# Patient Record
Sex: Female | Born: 1995 | Race: Black or African American | Hispanic: No | Smoking: Never smoker
Health system: Southern US, Community
[De-identification: ages and names within clinical notes are randomized; demographics above are authoritative.]

## PROBLEM LIST (undated history)

## (undated) DIAGNOSIS — Z789 Other specified health status: Secondary | ICD-10-CM

## (undated) HISTORY — PX: WISDOM TOOTH EXTRACTION: SHX21

## (undated) HISTORY — PX: TONSILLECTOMY: SUR1361

---

## 2013-01-05 ENCOUNTER — Emergency Department: Payer: Self-pay | Admitting: Emergency Medicine

## 2013-01-05 LAB — RAPID INFLUENZA A&B ANTIGENS

## 2020-09-04 DIAGNOSIS — Z03818 Encounter for observation for suspected exposure to other biological agents ruled out: Secondary | ICD-10-CM | POA: Diagnosis not present

## 2020-09-04 DIAGNOSIS — Z20822 Contact with and (suspected) exposure to covid-19: Secondary | ICD-10-CM | POA: Diagnosis not present

## 2020-09-10 ENCOUNTER — Other Ambulatory Visit: Payer: Self-pay

## 2020-09-10 ENCOUNTER — Ambulatory Visit (INDEPENDENT_AMBULATORY_CARE_PROVIDER_SITE_OTHER): Payer: Self-pay | Admitting: Obstetrics and Gynecology

## 2020-09-10 ENCOUNTER — Encounter: Payer: Self-pay | Admitting: Obstetrics and Gynecology

## 2020-09-10 VITALS — BP 125/85 | HR 87 | Ht 62.0 in | Wt 174.9 lb

## 2020-09-10 DIAGNOSIS — N912 Amenorrhea, unspecified: Secondary | ICD-10-CM | POA: Diagnosis not present

## 2020-09-10 LAB — POCT URINE PREGNANCY: Preg Test, Ur: POSITIVE — AB

## 2020-09-10 MED ORDER — VITAFOL GUMMIES 3.33-0.333-34.8 MG PO CHEW: CHEWABLE_TABLET | ORAL | 11 refills | Status: DC

## 2020-09-10 NOTE — Progress Notes (Signed)
HPI:      [name] is a 24 y.o. No obstetric history on file. who LMP was No LMP recorded.  Subjective:   She presents today for pregnancy confirmation.  By last menstrual period she is approximately 8 weeks estimated gestational age.  She is not having any nausea or vomiting.  She has not yet started prenatal vitamins. (Father the baby is a Health and safety inspector) Patient is interested in genetic testing and AFP testing for spina bifida.    Hx: The following portions of the patient's history were reviewed and updated as appropriate:             She  has no past medical history on file. She does not have a problem list on file. She  has a past surgical history that includes Tonsillectomy and Wisdom tooth extraction. Her family history includes Breast cancer in her mother; Hypertension in her mother. She  reports that she has never smoked. She has never used smokeless tobacco. She reports current alcohol use. She reports current drug use. Drug: Marijuana. She has a current medication list which includes the following prescription(s): vitafol gummies. She is allergic to red dye.       Review of Systems:  Review of Systems  Constitutional: Denied constitutional symptoms, night sweats, recent illness, fatigue, fever, insomnia and weight loss.  Eyes: Denied eye symptoms, eye pain, photophobia, vision change and visual disturbance.  Ears/Nose/Throat/Neck: Denied ear, nose, throat or neck symptoms, hearing loss, nasal discharge, sinus congestion and sore throat.  Cardiovascular: Denied cardiovascular symptoms, arrhythmia, chest pain/pressure, edema, exercise intolerance, orthopnea and palpitations.  Respiratory: Denied pulmonary symptoms, asthma, pleuritic pain, productive sputum, cough, dyspnea and wheezing.  Gastrointestinal: Denied, gastro-esophageal reflux, melena, nausea and vomiting.  Genitourinary: Denied genitourinary symptoms including symptomatic vaginal discharge, pelvic relaxation  issues, and urinary complaints.  Musculoskeletal: Denied musculoskeletal symptoms, stiffness, swelling, muscle weakness and myalgia.  Dermatologic: Denied dermatology symptoms, rash and scar.  Neurologic: Denied neurology symptoms, dizziness, headache, neck pain and syncope.  Psychiatric: Denied psychiatric symptoms, anxiety and depression.  Endocrine: Denied endocrine symptoms including hot flashes and night sweats.   Meds:   No current outpatient medications on file prior to visit.   No current facility-administered medications on file prior to visit.          Objective:     Vitals:   09/10/20 1010  BP: 125/85  Pulse: 87   Filed Weights   09/10/20 1010  Weight: 174 lb 14.4 oz (79.3 kg)              Urinary pregnancy test positive  Assessment:    No obstetric history on file. There are no problems to display for this patient.    1. Amenorrhea     Approximately [redacted] weeks gestational age based on last menstrual period  Not having any problems at this time.   Plan:            1.  Prenatal Plan 1.  The patient was given prenatal literature. 2.  She was continued on prenatal vitamins. 3.  A prenatal lab panel was ordered or drawn. 4.  An ultrasound was ordered to better determine an EDC. 5.  A nurse visit was scheduled. 6.  Genetic testing and testing for other inheritable conditions discussed in detail. She will decide in the future whether to have these labs performed. 7.  A general overview of pregnancy testing, visit schedule, ultrasound schedule, and prenatal care was discussed. 8.  COVID and its  risks associated with pregnancy, prevention by limiting exposure and use of masks, as well as the risks and benefits of vaccination during pregnancy were discussed in detail.  [facility] policy regarding office and hospital visitation and testing was explained. 9.  Benefits of breast-feeding discussed in detail including both maternal and infant benefits. Ready [employer] Baby  website discussed.   Orders Orders Placed This Encounter  Procedures  . US OB Comp Less 14 Wks  . US OB Transvaginal  . POCT urine pregnancy     Meds ordered this encounter  Medications  . Prenatal Vit-Fe Phos-FA-Omega (VITAFOL GUMMIES) 3.33-0.333-34.8 MG CHEW    Sig: Take three gummies in the AM    Dispense:  90 tablet    Refill:  11      F/U  Return in about 4 weeks (around 10/08/2020). I spent 32 minutes involved in the care of this patient preparing to see the patient by obtaining and reviewing her medical history (including labs, imaging tests and prior procedures), documenting clinical information in the electronic health record (EHR), counseling and coordinating care plans, writing and sending prescriptions, ordering tests or procedures and directly communicating with the patient by discussing pertinent items from her history and physical exam as well as detailing my assessment and plan as noted above so that she has an informed understanding.  All of her questions were answered.  Elonda Husky, M.D. 09/10/2020 10:39 AM

## 2020-09-15 ENCOUNTER — Ambulatory Visit (INDEPENDENT_AMBULATORY_CARE_PROVIDER_SITE_OTHER): Payer: Self-pay

## 2020-09-15 ENCOUNTER — Other Ambulatory Visit: Payer: Self-pay

## 2020-09-15 DIAGNOSIS — N912 Amenorrhea, unspecified: Secondary | ICD-10-CM

## 2020-09-26 ENCOUNTER — Telehealth: Payer: Self-pay

## 2020-09-26 NOTE — Telephone Encounter (Signed)
Spoke to pt concerning her call to the office. Pt stated that she was unable to hold anything down. Pt was advised to try small meal, like rice, potatoes, popiciles and sent safe medication list to pt along with information concerning morning sickness. Pt was advised that if those medication otc did not work call the office by Monday to be prescribed morning sickness medication.

## 2020-09-26 NOTE — Telephone Encounter (Signed)
Pt called in and stated that she isn't able to keep anything down, water or Crackers. The pt is requesting something that will help. The pt uses [facility] in [place]. Please advise

## 2020-09-29 ENCOUNTER — Other Ambulatory Visit: Payer: Self-pay

## 2020-09-29 ENCOUNTER — Ambulatory Visit
Admission: EM | Admit: 2020-09-29 | Discharge: 2020-09-29 | Disposition: A | Discharge status: Home / Self Care | Payer: Medicaid Other | Attending: Emergency Medicine | Admitting: Emergency Medicine

## 2020-09-29 DIAGNOSIS — L02419 Cutaneous abscess of limb, unspecified: Secondary | ICD-10-CM | POA: Diagnosis not present

## 2020-09-29 DIAGNOSIS — L03119 Cellulitis of unspecified part of limb: Secondary | ICD-10-CM | POA: Diagnosis not present

## 2020-09-29 MED ORDER — AMOXICILLIN-POT CLAVULANATE 875-125 MG PO TABS: 1.0000 | ORAL_TABLET | Freq: Two times a day (BID) | ORAL | 0 refills | Status: AC

## 2020-09-29 NOTE — Progress Notes (Deleted)
Pt presents for NOB nurse interview visit. Pregnancy confirmation done 09/10/20 [facility] Dr. Logan Bores. G1 . P0   . Pregnancy education material explained and given. ___ cats in the home. NOB labs ordered. (TSH/HbgA1c due to increased BMI), (sickle cell). HIV labs and Drug screen were explained optional and she did not decline. Drug screen ordered. PNV encouraged. Genetic screening options discussed . Genetic testing: Ordered/Declined/Unsure. Pt may discuss with provider. Pt. To follow up with provider in 2 weeks for NOB physical. Financial policy reviewed. FMLA paperwork policy reviewed and signed. All questions answered.

## 2020-09-29 NOTE — Discharge Instructions (Addendum)
Leave the packing in place for the next 2 days and then remove.  Take the Augmentin twice daily for 10 days.  Use over-the-counter Tylenol as needed for pain.  If you have any increased redness, drainage, pain, or swelling return or follow-up with your primary care doctor.

## 2020-09-29 NOTE — ED Provider Notes (Signed)
[facility]    CSN: [id number] Arrival date & time: 09/29/20  1251      History   Chief Complaint Chief Complaint  Patient presents with   Recurrent Skin Infections    HPI Emily Bullock is a 24 y.o. female.   24 year old female here for evaluation of swollen, tender area on her right buttock.  Patient reports that she has had this off and on since she was a teenager.  Most recently it started to increase in size and become tender 1 week ago.  She has been treating it at home with heat and fat back and it has not drained.  Patient is also [redacted] weeks pregnant.     History reviewed. No pertinent past medical history.  There are no problems to display for this patient.   Past Surgical History:  Procedure Laterality Date   TONSILLECTOMY     WISDOM TOOTH EXTRACTION      OB History    Gravida  1   Para      Term      Preterm      AB      Living        SAB      TAB      Ectopic      Multiple      Live Births               Home Medications    Prior to Admission medications   Medication Sig Start Date End Date Taking? Authorizing Provider  Prenatal Vit-Fe Phos-FA-Omega (VITAFOL GUMMIES) 3.33-0.333-34.8 MG CHEW Take three gummies in the AM 09/10/20  Yes Linzie Collin, MD  amoxicillin-clavulanate (AUGMENTIN) 875-125 MG tablet Take 1 tablet by mouth every 12 (twelve) hours for 10 days. 09/29/20 10/09/20  Becky Augusta, NP    Family History Family History  Problem Relation Age of Onset   Hypertension Mother    Breast cancer Mother     Social History Social History   Tobacco Use   Smoking status: Never Smoker   Smokeless tobacco: Never Used  Building services engineer Use: Former  Substance Use Topics   Alcohol use: Yes   Drug use: Yes    Types: Marijuana     Allergies   Red dye   Review of Systems Review of Systems  Constitutional: Negative for activity change, appetite change and fever.  Respiratory: Negative  for cough.   Skin: Positive for color change.       There is a red swollen area on the right buttock.  Hematological: Negative.   Psychiatric/Behavioral: Negative.      Physical Exam Triage Vital Signs ED Triage Vitals  Enc Vitals Group     BP 09/29/20 1338 133/89     Pulse Rate 09/29/20 1338 94     Resp 09/29/20 1338 18     Temp 09/29/20 1338 98 F (36.7 C)     Temp Source 09/29/20 1338 Oral     SpO2 09/29/20 1338 100 %     Weight 09/29/20 1336 171 lb (77.6 kg)     Height 09/29/20 1336 5\' 2"  (1.575 m)     Head Circumference --      Peak Flow --      Pain Score 09/29/20 1335 8     Pain Loc --      Pain Edu? --      Excl. in GC? --    No data found.  Updated Vital Signs  BP 133/89 (BP Location: Right Arm)    Pulse 94    Temp 98 F (36.7 C) (Oral)    Resp 18    Ht 5\' 2"  (1.575 m)    Wt 171 lb (77.6 kg)    LMP 07/16/2020    SpO2 100%    BMI 31.28 kg/m   Visual Acuity Right Eye Distance:   Left Eye Distance:   Bilateral Distance:    Right Eye Near:   Left Eye Near:    Bilateral Near:     Physical Exam Vitals and nursing note reviewed.  Constitutional:      General: She is not in acute distress.    Appearance: Normal appearance.  HENT:     Head: Normocephalic and atraumatic.  Eyes:     General: No scleral icterus.    Extraocular Movements: Extraocular movements intact.     Conjunctiva/sclera: Conjunctivae normal.     Pupils: Pupils are equal, round, and reactive to light.  Cardiovascular:     Rate and Rhythm: Normal rate and regular rhythm.     Pulses: Normal pulses.     Heart sounds: Normal heart sounds. No murmur heard.  No gallop.   Pulmonary:     Effort: Pulmonary effort is normal.     Breath sounds: Normal breath sounds. No wheezing, rhonchi or rales.  Musculoskeletal:        General: No swelling or tenderness. Normal range of motion.     Cervical back: Normal range of motion and neck supple.  Skin:    General: Skin is warm and dry.     Capillary  Refill: Capillary refill takes less than 2 seconds.     Findings: Lesion present.     Comments: There is a ping-pong ball size red, fluctuant area on the right buttock.  The surrounding tissue is indurated and erythematous as well.  Neurological:     General: No focal deficit present.     Mental Status: She is alert and oriented to person, place, and time.  Psychiatric:        Mood and Affect: Mood normal.        Behavior: Behavior normal.        Thought Content: Thought content normal.        Judgment: Judgment normal.      UC Treatments / Results  Labs (all labs ordered are listed, but only abnormal results are displayed) Labs Reviewed  AEROBIC CULTURE (SUPERFICIAL SPECIMEN)    EKG   Radiology No results found.  Procedures Incision and Drainage  Date/Time: 09/29/2020 2:24 PM Performed by: 13/11/2019, NP Authorized by: Becky Augusta, NP   Consent:    Consent obtained:  Verbal   Consent given by:  Patient   Risks discussed:  Bleeding, incomplete drainage, infection and pain   Alternatives discussed:  Alternative treatment Location:    Type:  Abscess   Location:  Lower extremity   Lower extremity location:  Buttock   Buttock location:  R buttock Pre-procedure details:    Skin preparation:  Chloraprep Anesthesia (see MAR for exact dosages):    Anesthesia method:  Local infiltration   Local anesthetic:  Lidocaine 1% WITH epi (4 mL) Procedure type:    Complexity:  Simple Procedure details:    Incision types:  Stab incision   Incision depth:  Subcutaneous   Scalpel blade:  11   Wound management:  Probed and deloculated   Drainage:  Bloody and purulent   Drainage amount:  Moderate  Wound treatment:  Wound left open   Packing materials:  1/2 in gauze   Amount 1/2":  6 inches Post-procedure details:    Patient tolerance of procedure:  Tolerated well, no immediate complications   (including critical care time)  Medications Ordered in UC Medications - No  data to display  Initial Impression / Assessment and Plan / UC Course  I have reviewed the triage vital signs and the nursing notes.  Pertinent labs & imaging results that were available during my care of the patient were reviewed by me and considered in my medical decision making (see chart for details).   Patient is here for evaluation of an abscess on her right buttock.  The abscess has been getting worse over the last week and despite home remedies has not drained.  Patient is [redacted] weeks pregnant and she is here for help with pain relief.  Patient has been trying to use heat to get it to drain and has been unsuccessful at home.  We discussed continuing to apply warm compresses or lance the area and she opted for the latter.  Culture of drainage collected and sent to lab for analysis.  Will DC home on Augmentin twice daily x10 days. Final Clinical Impressions(s) / UC Diagnoses   Final diagnoses:  Cellulitis and abscess of leg     Discharge Instructions     Leave the packing in place for the next 2 days and then remove.  Take the Augmentin twice daily for 10 days.  Use over-the-counter Tylenol as needed for pain.  If you have any increased redness, drainage, pain, or swelling return or follow-up with your primary care doctor.    ED Prescriptions    Medication Sig Dispense Auth. Provider   amoxicillin-clavulanate (AUGMENTIN) 875-125 MG tablet Take 1 tablet by mouth every 12 (twelve) hours for 10 days. 20 tablet Becky Augusta, NP     PDMP not reviewed this encounter.   Becky Augusta, NP 09/29/20 1435

## 2020-09-29 NOTE — ED Triage Notes (Addendum)
Patient complains of abscess on right buttock that has been ongoing for a while. States that it worsened over the last week, hasn't drained yet. Patient is currently [redacted] weeks pregnant. Patient states that she does have a piece of fat back still on the area.

## 2020-09-30 ENCOUNTER — Telehealth: Payer: Self-pay

## 2020-09-30 LAB — AEROBIC CULTURE  (SUPERFICIAL SPECIMEN)

## 2020-09-30 NOTE — Telephone Encounter (Signed)
Rescheduled patient

## 2020-09-30 NOTE — Telephone Encounter (Signed)
Called pt- no answer °mychart message sent °

## 2020-09-30 NOTE — Telephone Encounter (Signed)
Patient called in requesting nausea medication called in for her. Could you please advise?

## 2020-10-01 DIAGNOSIS — Z130 Encounter for screening for diseases of the blood and blood-forming organs and certain disorders involving the immune mechanism: Secondary | ICD-10-CM

## 2020-10-01 DIAGNOSIS — Z131 Encounter for screening for diabetes mellitus: Secondary | ICD-10-CM

## 2020-10-01 DIAGNOSIS — Z3A09 9 weeks gestation of pregnancy: Secondary | ICD-10-CM

## 2020-10-01 LAB — AEROBIC CULTURE  (SUPERFICIAL SPECIMEN)

## 2020-10-01 MED ORDER — DOXYLAMINE-PYRIDOXINE 10-10 MG PO TBEC
2.0000 | DELAYED_RELEASE_TABLET | Freq: Every day | ORAL | 5 refills | Status: DC
Start: 2020-10-01 — End: 2021-05-03

## 2020-10-01 NOTE — Telephone Encounter (Signed)
I have sent in Diclegis to the pharmacy. LM for patient that medication has been sent to the pharmacy.

## 2020-10-02 ENCOUNTER — Other Ambulatory Visit: Payer: Self-pay

## 2020-10-02 ENCOUNTER — Ambulatory Visit: Payer: Medicaid Other

## 2020-10-02 VITALS — BP 117/80 | HR 93 | Wt 172.1 lb

## 2020-10-02 DIAGNOSIS — Z3A09 9 weeks gestation of pregnancy: Secondary | ICD-10-CM

## 2020-10-02 LAB — AEROBIC CULTURE W GRAM STAIN (SUPERFICIAL SPECIMEN)

## 2020-10-02 NOTE — Progress Notes (Signed)
Truitt Merle presents for NOB nurse interview visit. Unsure LMP. Pregnancy confirmation done 09/10/20 Dr Logan Bores. Dating and viability ultrasound done 09/15/20 [redacted]w[redacted]d- EDD 05/05/21.  G-1 P-0. Pregnancy education material explained and given. 0  cats in the home. NOB labs ordered. TSH/HbgA1c due to Increased BMI, sickle cell. HIV labs and Drug screen were explained optional and she did not decline. Drug screen ordered. PNV encouraged. Genetic screening options discussed. Genetic testing: Ordered.  Pt may discuss with provider. Pt. To follow up with provider in _2_ weeks for NOB physical.  All questions answered. FMLA form signed, Financial policy reviewed.

## 2020-10-03 LAB — HEMOGLOBIN A1C
Est. average glucose Bld gHb Est-mCnc: 108 mg/dL
Hgb A1c MFr Bld: 5.4 % (ref 4.8–5.6)

## 2020-10-03 LAB — CBC WITH DIFFERENTIAL
Basophils Absolute: 0 10*3/uL (ref 0.0–0.2)
Basos: 1 %
EOS (ABSOLUTE): 0.1 10*3/uL (ref 0.0–0.4)
Eos: 1 %
Hematocrit: 40.8 % (ref 34.0–46.6)
Hemoglobin: 13.8 g/dL (ref 11.1–15.9)
Immature Grans (Abs): 0 10*3/uL (ref 0.0–0.1)
Immature Granulocytes: 1 %
Lymphocytes Absolute: 1.7 10*3/uL (ref 0.7–3.1)
Lymphs: 24 %
MCH: 32 pg (ref 26.6–33.0)
MCHC: 33.8 g/dL (ref 31.5–35.7)
MCV: 95 fL (ref 79–97)
Monocytes Absolute: 0.5 10*3/uL (ref 0.1–0.9)
Monocytes: 7 %
Neutrophils Absolute: 4.7 10*3/uL (ref 1.4–7.0)
Neutrophils: 66 %
RBC: 4.31 x10E6/uL (ref 3.77–5.28)
RDW: 11.1 % — ABNORMAL LOW (ref 11.7–15.4)
WBC: 7 10*3/uL (ref 3.4–10.8)

## 2020-10-03 LAB — HIV ANTIBODY (ROUTINE TESTING W REFLEX): HIV Screen 4th Generation wRfx: NONREACTIVE

## 2020-10-03 LAB — ABO AND RH: Rh Factor: POSITIVE

## 2020-10-03 LAB — URINALYSIS, ROUTINE W REFLEX MICROSCOPIC

## 2020-10-03 LAB — TSH: TSH: 2.92 u[IU]/mL (ref 0.450–4.500)

## 2020-10-03 LAB — RPR: RPR Ser Ql: NONREACTIVE

## 2020-10-03 LAB — RUBELLA SCREEN: Rubella Antibodies, IGG: 4.74 index (ref >0.99)

## 2020-10-03 LAB — VARICELLA ZOSTER ANTIBODY, IGG: Varicella zoster IgG: 1999 index (ref >165)

## 2020-10-03 LAB — HEPATITIS B SURFACE ANTIGEN: Hepatitis B Surface Ag: NEGATIVE

## 2020-10-03 LAB — HGB SOLU + RFLX FRAC: Sickle Solubility Test - HGBRFX: NEGATIVE

## 2020-10-03 LAB — ANTIBODY SCREEN: Antibody Screen: NEGATIVE

## 2020-10-04 LAB — GC/CHLAMYDIA PROBE AMP
Chlamydia trachomatis, NAA: NEGATIVE
Neisseria Gonorrhoeae by PCR: NEGATIVE

## 2020-10-04 LAB — URINE CULTURE

## 2020-10-07 LAB — MATERNIT 21 PLUS CORE, BLOOD
Fetal Fraction: 6
Result (T21): NEGATIVE
Trisomy 13 (Patau syndrome): NEGATIVE
Trisomy 18 (Edwards syndrome): NEGATIVE
Trisomy 21 (Down syndrome): NEGATIVE

## 2020-10-09 ENCOUNTER — Encounter: Payer: Self-pay | Admitting: Obstetrics and Gynecology

## 2020-10-16 ENCOUNTER — Other Ambulatory Visit (HOSPITAL_COMMUNITY)
Admission: RE | Admit: 2020-10-16 | Discharge: 2020-10-16 | Disposition: A | Discharge status: Home / Self Care | Payer: Medicaid Other | Source: Ambulatory Visit | Attending: Obstetrics and Gynecology | Admitting: Obstetrics and Gynecology

## 2020-10-16 ENCOUNTER — Other Ambulatory Visit: Payer: Self-pay

## 2020-10-16 ENCOUNTER — Ambulatory Visit (INDEPENDENT_AMBULATORY_CARE_PROVIDER_SITE_OTHER): Payer: Medicaid Other | Admitting: Obstetrics and Gynecology

## 2020-10-16 ENCOUNTER — Encounter: Payer: Self-pay | Admitting: Obstetrics and Gynecology

## 2020-10-16 VITALS — BP 132/81 | HR 92 | Wt 179.6 lb

## 2020-10-16 DIAGNOSIS — Z124 Encounter for screening for malignant neoplasm of cervix: Secondary | ICD-10-CM

## 2020-10-16 DIAGNOSIS — Z3491 Encounter for supervision of normal pregnancy, unspecified, first trimester: Secondary | ICD-10-CM

## 2020-10-16 DIAGNOSIS — Z3A11 11 weeks gestation of pregnancy: Secondary | ICD-10-CM

## 2020-10-16 LAB — POCT URINALYSIS DIPSTICK OB
Bilirubin, UA: NEGATIVE
Blood, UA: NEGATIVE
Glucose, UA: NEGATIVE
Ketones, UA: NEGATIVE
Leukocytes, UA: NEGATIVE
Nitrite, UA: NEGATIVE
POC,PROTEIN,UA: NEGATIVE
Spec Grav, UA: 1.01 (ref 1.010–1.025)
Urobilinogen, UA: 0.2 E.U./dL
pH, UA: 6.5 (ref 5.0–8.0)

## 2020-10-16 NOTE — Addendum Note (Signed)
Addended by: Dorian Pod on: 10/16/2020 11:16 AM   Modules accepted: Orders

## 2020-10-16 NOTE — Progress Notes (Addendum)
NOB: Patient doing well. Has occasional headaches. Hydration discussed use of Tylenol discussed. She is otherwise well. Advised to begin prenatal vitamins as she did not pick up her prescription yet. Discussed genetic testing-normal. Patient will consider AFP at next visit. Pap performed today.  Physical examination General NAD, Conversant  HEENT Atraumatic; Op clear with mmm.  Normo-cephalic. Pupils reactive. Anicteric sclerae  Thyroid/Neck Smooth without nodularity or enlargement. Normal ROM.  Neck Supple.  Skin No rashes, lesions or ulceration. Normal palpated skin turgor. No nodularity.  Breasts: No masses or discharge.  Symmetric.  No axillary adenopathy.  Lungs: Clear to auscultation.No rales or wheezes. Normal Respiratory effort, no retractions.  Heart: NSR.  No murmurs or rubs appreciated. No periferal edema  Abdomen: Soft.  Non-tender.  No masses.  No HSM. No hernia  Extremities: Moves all appropriately.  Normal ROM for age. No lymphadenopathy.  Neuro: Oriented to PPT.  Normal mood. Normal affect.     Pelvic:   Vulva: Normal appearance.  No lesions.  Vagina: No lesions or abnormalities noted.  Support: Normal pelvic support.  Urethra No masses tenderness or scarring.  Meatus Normal size without lesions or prolapse.  Cervix: Normal appearance.  No lesions.  Anus: Normal exam.  No lesions.  Perineum: Normal exam.  No lesions.        Bimanual   Adnexae: No masses.  Non-tender to palpation.  Uterus: Enlarged. 12wks Pos FHTs  Non-tender.  Mobile.  AV.  Adnexae: No masses.  Non-tender to palpation.  Cul-de-sac: Negative for abnormality.  Adnexae: No masses.  Non-tender to palpation.         Pelvimetry   Diagonal: Reached.  Spines: Average.  Sacrum: Concave.  Pubic Arch: Normal.   The following were addressed during this visit:  Breastfeeding Education - Early initiation of breastfeeding    Comments: Keeps milk supply adequate, helps contract uterus and slow bleeding, and  early milk is the perfect first food and is easy to digest.   - The importance of exclusive breastfeeding    Comments: Provides antibodies, Lower risk of breast and ovarian cancers, and type-2 diabetes,Helps your body recover, Reduced chance of SIDS.   - The importance of early skin-to-skin contact    Comments: Keeps baby warm and secure, helps keep baby's blood sugar up and breathing steady, easier to bond and breastfeed, and helps calm baby.  - Rooming-in on a 24-hour basis    Comments: Easier to learn baby's feeding cues, easier to bond and get to know each other, and encourages milk production.  I spent 23 minutes involved in the care of this patient preparing to see the patient by obtaining and reviewing her medical history (including labs, imaging tests and prior procedures), documenting clinical information in the electronic health record (EHR), counseling and coordinating care plans, writing and sending prescriptions, ordering tests or procedures and directly communicating with the patient by discussing pertinent items from her history and physical exam as well as detailing my assessment and plan as noted above so that she has an informed understanding.  All of her questions were answered.

## 2020-10-16 NOTE — Addendum Note (Signed)
Addended by: Dorian Pod on: 10/16/2020 01:24 PM   Modules accepted: Orders

## 2020-10-17 LAB — CYTOLOGY - PAP: Diagnosis: NEGATIVE

## 2020-11-19 ENCOUNTER — Other Ambulatory Visit: Payer: Self-pay

## 2020-11-19 ENCOUNTER — Encounter: Payer: Self-pay | Admitting: Obstetrics and Gynecology

## 2020-11-19 ENCOUNTER — Ambulatory Visit (INDEPENDENT_AMBULATORY_CARE_PROVIDER_SITE_OTHER): Payer: Medicaid Other | Admitting: Obstetrics and Gynecology

## 2020-11-19 VITALS — BP 116/77 | HR 91 | Wt 186.7 lb

## 2020-11-19 DIAGNOSIS — Z3A16 16 weeks gestation of pregnancy: Secondary | ICD-10-CM

## 2020-11-19 DIAGNOSIS — Z3402 Encounter for supervision of normal first pregnancy, second trimester: Secondary | ICD-10-CM

## 2020-11-19 LAB — POCT URINALYSIS DIPSTICK OB
Bilirubin, UA: NEGATIVE
Blood, UA: NEGATIVE
Glucose, UA: NEGATIVE
Ketones, UA: NEGATIVE
Nitrite, UA: NEGATIVE
POC,PROTEIN,UA: NEGATIVE
Spec Grav, UA: 1.01 (ref 1.010–1.025)
Urobilinogen, UA: 0.2 E.U./dL
pH, UA: 7 (ref 5.0–8.0)

## 2020-11-19 NOTE — Patient Instructions (Addendum)
WHAT OB PATIENTS CAN EXPECT   Confirmation of pregnancy and ultrasound ordered if medically indicated-[redacted] weeks gestation  New OB (NOB) intake with nurse and New OB (NOB) labs- [redacted] weeks gestation  New OB (NOB) physical examination with provider- 11/[redacted] weeks gestation  Flu vaccine-[redacted] weeks gestation  Anatomy scan-[redacted] weeks gestation  Glucose tolerance test, blood work to test for anemia, T-dap vaccine-[redacted] weeks gestation  Vaginal swabs/cultures-STD/Group B strep-[redacted] weeks gestation  Appointments every 4 weeks until 28 weeks  Every 2 weeks from 28 weeks until 36 weeks  Weekly visits from 36 weeks until delivery   Second Trimester of Pregnancy  The second trimester is from week 14 through week 27 (month 4 through 6). This is often the time in pregnancy that you feel your best. Often times, morning sickness has lessened or quit. You may have more energy, and you may get hungry more often. Your unborn baby is growing rapidly. At the end of the sixth month, he or she is about 9 inches long and weighs about 1 pounds. You will likely feel the baby move between 18 and 20 weeks of pregnancy. Follow these instructions at home: Medicines  Take over-the-counter and prescription medicines only as told by your doctor. Some medicines are safe and some medicines are not safe during pregnancy.  Take a prenatal vitamin that contains at least 600 micrograms (mcg) of folic acid.  If you have trouble pooping (constipation), take medicine that will make your stool soft (stool softener) if your doctor approves. Eating and drinking   Eat regular, healthy meals.  Avoid raw meat and uncooked cheese.  If you get low calcium from the food you eat, talk to your doctor about taking a daily calcium supplement.  Avoid foods that are high in fat and sugars, such as fried and sweet foods.  If you feel sick to your stomach (nauseous) or throw up (vomit): ? Eat 4 or 5 small meals a day instead of 3 large  meals. ? Try eating a few soda crackers. ? Drink liquids between meals instead of during meals.  To prevent constipation: ? Eat foods that are high in fiber, like fresh fruits and vegetables, whole grains, and beans. ? Drink enough fluids to keep your pee (urine) clear or pale yellow. Activity  Exercise only as told by your doctor. Stop exercising if you start to have cramps.  Do not exercise if it is too hot, too humid, or if you are in a place of great height (high altitude).  Avoid heavy lifting.  Wear low-heeled shoes. Sit and stand up straight.  You can continue to have sex unless your doctor tells you not to. Relieving pain and discomfort  Wear a good support bra if your breasts are tender.  Take warm water baths (sitz baths) to soothe pain or discomfort caused by hemorrhoids. Use hemorrhoid cream if your doctor approves.  Rest with your legs raised if you have leg cramps or low back pain.  If you develop puffy, bulging veins (varicose veins) in your legs: ? Wear support hose or compression stockings as told by your doctor. ? Raise (elevate) your feet for 15 minutes, 3-4 times a day. ? Limit salt in your food. Prenatal care  Write down your questions. Take them to your prenatal visits.  Keep all your prenatal visits as told by your doctor. This is important. Safety  Wear your seat belt when driving.  Make a list of emergency phone numbers, including numbers for family, friends,  the hospital, and police and fire departments. General instructions  Ask your doctor about the right foods to eat or for help finding a counselor, if you need these services.  Ask your doctor about local prenatal classes. Begin classes before month 6 of your pregnancy.  Do not use hot tubs, steam rooms, or saunas.  Do not douche or use tampons or scented sanitary pads.  Do not cross your legs for long periods of time.  Visit your dentist if you have not done so. Use a soft toothbrush  to brush your teeth. Floss gently.  Avoid all smoking, herbs, and alcohol. Avoid drugs that are not approved by your doctor.  Do not use any products that contain nicotine or tobacco, such as cigarettes and e-cigarettes. If you need help quitting, ask your doctor.  Avoid cat litter boxes and soil used by cats. These carry germs that can cause birth defects in the baby and can cause a loss of your baby (miscarriage) or stillbirth. Contact a doctor if:  You have mild cramps or pressure in your lower belly.  You have pain when you pee (urinate).  You have bad smelling fluid coming from your vagina.  You continue to feel sick to your stomach (nauseous), throw up (vomit), or have watery poop (diarrhea).  You have a nagging pain in your belly area.  You feel dizzy. Get help right away if:  You have a fever.  You are leaking fluid from your vagina.  You have spotting or bleeding from your vagina.  You have severe belly cramping or pain.  You lose or gain weight rapidly.  You have trouble catching your breath and have chest pain.  You notice sudden or extreme puffiness (swelling) of your face, hands, ankles, feet, or legs.  You have not felt the baby move in over an hour.  You have severe headaches that do not go away when you take medicine.  You have trouble seeing. Summary  The second trimester is from week 14 through week 27 (months 4 through 6). This is often the time in pregnancy that you feel your best.  To take care of yourself and your unborn baby, you will need to eat healthy meals, take medicines only if your doctor tells you to do so, and do activities that are safe for you and your baby.  Call your doctor if you get sick or if you notice anything unusual about your pregnancy. Also, call your doctor if you need help with the right food to eat, or if you want to know what activities are safe for you. This information is not intended to replace advice given to you by  your health care provider. Make sure you discuss any questions you have with your health care provider. Document Revised: 03/09/2019 Document Reviewed: 12/21/2016 Elsevier Patient Education  2020 [employer]. Commonly Asked Questions During Pregnancy  Cats: A parasite can be excreted in cat feces.  To avoid exposure you need to have another person empty the little box.  If you must empty the litter box you will need to wear gloves.  Wash your hands after handling your cat.  This parasite can also be found in raw or undercooked meat so this should also be avoided.  Colds, Sore Throats, Flu: Please check your medication sheet to see what you can take for symptoms.  If your symptoms are unrelieved by these medications please call the office.  Dental Work: Most any dental work Investment banker, corporate recommends is  permitted.  X-rays should only be taken during the first trimester if absolutely necessary.  Your abdomen should be shielded with a lead apron during all x-rays.  Please notify your provider prior to receiving any x-rays.  Novocaine is fine; gas is not recommended.  If your dentist requires a note from [place] prior to dental work please call the office and we will provide one for you.  Exercise: Exercise is an important part of staying healthy during your pregnancy.  You may continue most exercises you were accustomed to prior to pregnancy.  Later in your pregnancy you will most likely notice you have difficulty with activities requiring balance like riding a bicycle.  It is important that you listen to your body and avoid activities that put you at a higher risk of falling.  Adequate rest and staying well hydrated are a must!  If you have questions about the safety of specific activities ask your provider.    Exposure to Children with illness: Try to avoid obvious exposure; report any symptoms to [place] when noted,  If you have chicken pos, red measles or mumps, you should be immune to these diseases.   Please do  not take any vaccines while pregnant unless you have checked with your OB provider.  Fetal Movement: After 28 weeks we recommend you do "kick counts" twice daily.  Lie or sit down in a calm quiet environment and count your baby movements "kicks".  You should feel your baby at least 10 times per hour.  If you have not felt 10 kicks within the first hour get up, walk around and have something sweet to eat or drink then repeat for an additional hour.  If count remains less than 10 per hour notify your provider.  Fumigating: Follow your pest control agent's advice as to how long to stay out of your home.  Ventilate the area well before re-entering.  Hemorrhoids:   Most over-the-counter preparations can be used during pregnancy.  Check your medication to see what is safe to use.  It is important to use a stool softener or fiber in your diet and to drink lots of liquids.  If hemorrhoids seem to be getting worse please call the office.   Hot Tubs:  Hot tubs Jacuzzis and saunas are not recommended while pregnant.  These increase your internal body temperature and should be avoided.  Intercourse:  Sexual intercourse is safe during pregnancy as long as you are comfortable, unless otherwise advised by your provider.  Spotting may occur after intercourse; report any bright red bleeding that is heavier than spotting.  Labor:  If you know that you are in labor, please go to the hospital.  If you are unsure, please call the office and let us help you decide what to do.  Lifting, straining, etc:  If your job requires heavy lifting or straining please check with your provider for any limitations.  Generally, you should not lift items heavier than that you can lift simply with your hands and arms (no back muscles)  Painting:  Paint fumes do not harm your pregnancy, but may make you ill and should be avoided if possible.  Latex or water based paints have less odor than oils.  Use adequate ventilation while  painting.  Permanents & Hair Color:  Chemicals in hair dyes are not recommended as they cause increase hair dryness which can increase hair loss during pregnancy.  " Highlighting" and permanents are allowed.  Dye may be absorbed differently  and permanents may not hold as well during pregnancy.  Sunbathing:  Use a sunscreen, as skin burns easily during pregnancy.  Drink plenty of fluids; avoid over heating.  Tanning Beds:  Because their possible side effects are still unknown, tanning beds are not recommended.  Ultrasound Scans:  Routine ultrasounds are performed at approximately 20 weeks.  You will be able to see your baby's general anatomy an if you would like to know the gender this can usually be determined as well.  If it is questionable when you conceived you may also receive an ultrasound early in your pregnancy for dating purposes.  Otherwise ultrasound exams are not routinely performed unless there is a medical necessity.  Although you can request a scan we ask that you pay for it when conducted because insurance does not cover " patient request" scans.  Work: If your pregnancy proceeds without complications you may work until your due date, unless your physician or employer advises otherwise.  Round Ligament Pain/Pelvic Discomfort:  Sharp, shooting pains not associated with bleeding are fairly common, usually occurring in the second trimester of pregnancy.  They tend to be worse when standing up or when you remain standing for long periods of time.  These are the result of pressure of certain pelvic ligaments called "round ligaments".  Rest, Tylenol and heat seem to be the most effective relief.  As the womb and fetus grow, they rise out of the pelvis and the discomfort improves.  Please notify the office if your pain seems different than that described.  It may represent a more serious condition.  Common Medications Safe in Pregnancy  Acne:      Constipation:  Benzoyl  Peroxide     Colace  Clindamycin      Dulcolax Suppository  Topica Erythromycin     Fibercon  Salicylic Acid      Metamucil         Miralax AVOID:        Senakot   Accutane    Cough:  Retin-A       Cough Drops  Tetracycline      Phenergan w/ Codeine if Rx  Minocycline      Robitussin (Plain & DM)  Antibiotics:     Crabs/Lice:  Ceclor       RID  Cephalosporins    AVOID:  E-Mycins      Kwell  Keflex  Macrobid/Macrodantin   Diarrhea:  Penicillin      Kao-Pectate  Zithromax      Imodium AD         PUSH FLUIDS AVOID:       Cipro     Fever:  Tetracycline      Tylenol (Regular or Extra  Minocycline       Strength)  Levaquin      Extra Strength-Do not          Exceed 8 tabs/24 hrs Caffeine:        200mg /day (equiv. To 1 cup of coffee or  approx. 3 12 oz sodas)         Gas: Cold/Hayfever:       Gas-X  Benadryl      Mylicon  Claritin       Phazyme  **Claritin-D        Chlor-Trimeton    Headaches:  Dimetapp      ASA-Free Excedrin  Drixoral-Non-Drowsy     Cold Compress  Mucinex (Guaifenasin)     Tylenol (Regular or Extra  Sudafed/Sudafed-12 Hour     Strength)  **Sudafed PE Pseudoephedrine   Tylenol Cold & Sinus     Vicks Vapor Rub  Zyrtec  **AVOID if Problems With Blood Pressure         Heartburn: Avoid lying down for at least 1 hour after meals  Aciphex      Maalox     Rash:  Milk of Magnesia     Benadryl    Mylanta       1% Hydrocortisone Cream  Pepcid  Pepcid Complete   Sleep Aids:  Prevacid      Ambien   Prilosec       Benadryl  Rolaids       Chamomile Tea  Tums (Limit 4/day)     Unisom         Tylenol PM         Warm milk-add vanilla or  Hemorrhoids:       Sugar for taste  Anusol/Anusol H.C.  (RX: Analapram 2.5%)  Sugar Substitutes:  Hydrocortisone OTC     Ok in moderation  Preparation H      Tucks        Vaseline lotion applied to tissue with wiping    Herpes:     Throat:  Acyclovir      Oragel  Famvir  Valtrex     Vaccines:         Flu  Shot Leg Cramps:       *Gardasil  Benadryl      Hepatitis A         Hepatitis B Nasal Spray:       Pneumovax  Saline Nasal Spray     Polio Booster         Tetanus Nausea:       Tuberculosis test or PPD  Vitamin B6 25 mg TID   AVOID:    Dramamine      *Gardasil  Emetrol       Live Poliovirus  Ginger Root 250 mg QID    MMR (measles, mumps &  High Complex Carbs @ Bedtime    rebella)  Sea Bands-Accupressure    Varicella (Chickenpox)  Unisom 1/2 tab TID     *No known complications           If received before Pain:         Known pregnancy;   Darvocet       Resume series after  Lortab        Delivery  Percocet    Yeast:   Tramadol      Femstat  Tylenol 3      Gyne-lotrimin  Ultram       Monistat  Vicodin           MISC:         All Sunscreens           Hair Coloring/highlights          Insect Repellant's          (Including DEET)         Mystic Tans

## 2020-11-19 NOTE — Progress Notes (Signed)
ROB: Patient reports concerns about when her next ultrasound is. Has been very nervous since she has only felt her baby once. Discussed prenatal care and what to expect.  She also notes breast tenderness. Discussed relief measures. Normal MaterniT21. RTC in 4 weeks, for anatomy scan and AFP at that time (lab closed prior to completion of today's visit). Declines flu vaccine.

## 2020-11-19 NOTE — Progress Notes (Signed)
ROB-Pt present for routine prenatal care.  Pt stated that she was doing well. Pt did have some issues at work concerning needing work limitations.

## 2020-11-29 NOTE — L&D Delivery Note (Signed)
       Delivery Note   Emily Bullock is a 25 y.o. G1P1001 at [redacted]w[redacted]d Estimated Date of Delivery: 05/05/21  PRE-OPERATIVE DIAGNOSIS:  1) [redacted]w[redacted]d pregnancy.    POST-OPERATIVE DIAGNOSIS:  1) [redacted]w[redacted]d pregnancy s/p Vaginal, Spontaneous    Delivery Type: Vaginal, Spontaneous    Delivery Anesthesia: Epidural   Labor Complications:      ESTIMATED BLOOD LOSS: 275  ml    FINDINGS:   1) female infant, Apgar scores of    at 1 minute and    at 5 minutes and a birthweight of 105.82  ounces.    2) Nuchal cord:   SPECIMENS:   PLACENTA:   Appearance: Intact    Removal: Spontaneous      Disposition:    DISPOSITION:  Infant to transported to Neonatal ICU  NARRATIVE SUMMARY: Labor course:  [name] is a G1P1001 at [redacted]w[redacted]d who presented for labor management.  She progressed slowly in labor.  Had a cat 2 tracing throughout labor with mode mec noted at [facility].  She received pitocin argumentation of labor but after she was noted to have a spontaneous prolonged deceleration it was turned off.  Her cervix at that time had progressed to 7.5cm. Her contractions spaced out and became mild.  The baby recovered and the pitocin was restarted.  The patient showed rapid progress to complete dilation and pushed for only a short time.  The infant delivered and moderate mec was again noted.  The cord was rapidly cut and the baby was handed to the neonatal staff present for resuscitation.   The placenta delivered without problems and was noted to be complete. A perineal and vaginal examination was performed. Episiotomy/Lacerations: 2nd degree  Episiotomy or lacerations were repaired with Vicryl suture using local anesthesia. The patient tolerated this well.    Elonda Husky, M.D. 05/01/2021 1:52 PM

## 2020-12-10 ENCOUNTER — Telehealth: Payer: Self-pay

## 2020-12-10 NOTE — Telephone Encounter (Signed)
Pt called in and stated that she would like a sooner appt. I told her I will send a message and that a nurse will be in touch. Can the pt be scheduled at the Hospital? The pt has medicaid. I know a PA will need to be done. And a order placed. Please advise

## 2020-12-10 NOTE — Telephone Encounter (Signed)
BH said that she discussed this with you.

## 2020-12-11 NOTE — Telephone Encounter (Signed)
Scheduled pts scan at [facility] on 1/21 at 2:15. Pt aware to have full bladder.  R/s her ob visit to the latest time on 1/26 per her request.  NO Pa required for scan.

## 2020-12-18 ENCOUNTER — Encounter: Payer: Medicaid Other | Admitting: Obstetrics and Gynecology

## 2020-12-18 ENCOUNTER — Other Ambulatory Visit: Payer: Medicaid Other

## 2020-12-19 ENCOUNTER — Other Ambulatory Visit: Payer: Self-pay

## 2020-12-19 ENCOUNTER — Ambulatory Visit
Admission: RE | Admit: 2020-12-19 | Discharge: 2020-12-19 | Disposition: A | Discharge status: Home / Self Care | Payer: Medicaid Other | Source: Ambulatory Visit | Attending: Obstetrics and Gynecology | Admitting: Obstetrics and Gynecology

## 2020-12-19 DIAGNOSIS — Z3402 Encounter for supervision of normal first pregnancy, second trimester: Secondary | ICD-10-CM | POA: Insufficient documentation

## 2020-12-19 DIAGNOSIS — Z3A20 20 weeks gestation of pregnancy: Secondary | ICD-10-CM | POA: Diagnosis not present

## 2020-12-19 DIAGNOSIS — O321XX0 Maternal care for breech presentation, not applicable or unspecified: Secondary | ICD-10-CM | POA: Diagnosis not present

## 2020-12-24 ENCOUNTER — Encounter: Payer: Medicaid Other | Admitting: Obstetrics and Gynecology

## 2020-12-24 ENCOUNTER — Other Ambulatory Visit: Payer: Medicaid Other

## 2020-12-30 ENCOUNTER — Encounter: Payer: Self-pay | Admitting: Obstetrics and Gynecology

## 2021-01-06 ENCOUNTER — Other Ambulatory Visit: Payer: Self-pay

## 2021-01-06 ENCOUNTER — Ambulatory Visit (INDEPENDENT_AMBULATORY_CARE_PROVIDER_SITE_OTHER): Payer: Medicaid Other | Admitting: Obstetrics and Gynecology

## 2021-01-06 VITALS — BP 124/83 | HR 99 | Wt 191.5 lb

## 2021-01-06 DIAGNOSIS — Z3A22 22 weeks gestation of pregnancy: Secondary | ICD-10-CM

## 2021-01-06 DIAGNOSIS — Z3402 Encounter for supervision of normal first pregnancy, second trimester: Secondary | ICD-10-CM

## 2021-01-06 DIAGNOSIS — Z3A23 23 weeks gestation of pregnancy: Secondary | ICD-10-CM

## 2021-01-06 LAB — POCT URINALYSIS DIPSTICK OB
Appearance: NORMAL
Bilirubin, UA: NEGATIVE
Blood, UA: NEGATIVE
Glucose, UA: NEGATIVE
Ketones, UA: NEGATIVE
Leukocytes, UA: NEGATIVE
Nitrite, UA: NEGATIVE
Odor: NORMAL
POC,PROTEIN,UA: NEGATIVE
Spec Grav, UA: 1.02 (ref 1.010–1.025)
Urobilinogen, UA: 0.2 E.U./dL
pH, UA: 6.5 (ref 5.0–8.0)

## 2021-01-06 NOTE — Progress Notes (Signed)
ROB: Patient generally doing well.  Reports daily fetal movement.  She says that she is having occasional sharp pains -after discussion these seem likely to be round ligament pain.  1 hour GCT next visit.  Patient missed her last appointment because she felt nauseated and did not want to come in.

## 2021-01-06 NOTE — Progress Notes (Signed)
She felt one sharp pain last night. Otherwise she feels good.

## 2021-02-04 ENCOUNTER — Other Ambulatory Visit: Payer: Medicaid Other

## 2021-02-04 ENCOUNTER — Encounter: Payer: Medicaid Other | Admitting: Obstetrics and Gynecology

## 2021-02-05 ENCOUNTER — Encounter: Payer: Self-pay | Admitting: Obstetrics and Gynecology

## 2021-02-05 ENCOUNTER — Ambulatory Visit (INDEPENDENT_AMBULATORY_CARE_PROVIDER_SITE_OTHER): Payer: Medicaid Other | Admitting: Obstetrics and Gynecology

## 2021-02-05 ENCOUNTER — Other Ambulatory Visit: Payer: Self-pay | Admitting: Surgical

## 2021-02-05 ENCOUNTER — Other Ambulatory Visit: Payer: Self-pay

## 2021-02-05 ENCOUNTER — Other Ambulatory Visit: Payer: Medicaid Other

## 2021-02-05 ENCOUNTER — Observation Stay
Admission: EM | Admit: 2021-02-05 | Discharge: 2021-02-05 | Disposition: A | Discharge status: Home / Self Care | Payer: Medicaid Other | Attending: Obstetrics and Gynecology | Admitting: Obstetrics and Gynecology

## 2021-02-05 VITALS — BP 123/77 | HR 95 | Wt 201.8 lb

## 2021-02-05 DIAGNOSIS — Z3403 Encounter for supervision of normal first pregnancy, third trimester: Secondary | ICD-10-CM | POA: Diagnosis not present

## 2021-02-05 DIAGNOSIS — Z3A27 27 weeks gestation of pregnancy: Secondary | ICD-10-CM | POA: Diagnosis not present

## 2021-02-05 DIAGNOSIS — Z3402 Encounter for supervision of normal first pregnancy, second trimester: Secondary | ICD-10-CM

## 2021-02-05 DIAGNOSIS — Z3A28 28 weeks gestation of pregnancy: Secondary | ICD-10-CM

## 2021-02-05 DIAGNOSIS — O368130 Decreased fetal movements, third trimester, not applicable or unspecified: Secondary | ICD-10-CM | POA: Diagnosis not present

## 2021-02-05 DIAGNOSIS — Z23 Encounter for immunization: Secondary | ICD-10-CM | POA: Diagnosis not present

## 2021-02-05 DIAGNOSIS — O99213 Obesity complicating pregnancy, third trimester: Secondary | ICD-10-CM

## 2021-02-05 LAB — POCT URINALYSIS DIPSTICK OB
Bilirubin, UA: NEGATIVE
Blood, UA: NEGATIVE
Glucose, UA: NEGATIVE
Ketones, UA: NEGATIVE
Leukocytes, UA: NEGATIVE
Nitrite, UA: NEGATIVE
POC,PROTEIN,UA: NEGATIVE
Spec Grav, UA: <=1.005 — AB (ref 1.010–1.025)
Urobilinogen, UA: 0.2 E.U./dL
pH, UA: 7 (ref 5.0–8.0)

## 2021-02-05 MED ORDER — TETANUS-DIPHTH-ACELL PERTUSSIS 5-2.5-18.5 LF-MCG/0.5 IM SUSY
0.5000 mL | PREFILLED_SYRINGE | Freq: Once | INTRAMUSCULAR | Status: AC
  Administered 2021-02-05: 0.5 mL via INTRAMUSCULAR

## 2021-02-05 NOTE — OB Triage Note (Signed)
Pt is a 24y/o G1P0 at [redacted]w[redacted]d with c/o decreased fetal movement since her glucose test today at 1030 . Pt denies LOF, CTX and VB. Monitors applied and assessing. Initial [id number].

## 2021-02-05 NOTE — Patient Instructions (Signed)
Common Medications Safe in Pregnancy  Acne:      Constipation:  Benzoyl Peroxide     Colace  Clindamycin      Dulcolax Suppository  Topica Erythromycin     Fibercon  Salicylic Acid      Metamucil         Miralax AVOID:        Senakot   Accutane    Cough:  Retin-A       Cough Drops  Tetracycline      Phenergan w/ Codeine if Rx  Minocycline      Robitussin (Plain & DM)  Antibiotics:     Crabs/Lice:  Ceclor       RID  Cephalosporins    AVOID:  E-Mycins      Kwell  Keflex  Macrobid/Macrodantin   Diarrhea:  Penicillin      Kao-Pectate  Zithromax      Imodium AD         PUSH FLUIDS AVOID:       Cipro     Fever:  Tetracycline      Tylenol (Regular or Extra  Minocycline       Strength)  Levaquin      Extra Strength-Do not          Exceed 8 tabs/24 hrs Caffeine:        <200mg/day (equiv. To 1 cup of coffee or  approx. 3 12 oz sodas)         Gas: Cold/Hayfever:       Gas-X  Benadryl      Mylicon  Claritin       Phazyme  **Claritin-D        Chlor-Trimeton    Headaches:  Dimetapp      ASA-Free Excedrin  Drixoral-Non-Drowsy     Cold Compress  Mucinex (Guaifenasin)     Tylenol (Regular or Extra  Sudafed/Sudafed-12 Hour     Strength)  **Sudafed PE Pseudoephedrine   Tylenol Cold & Sinus     Vicks Vapor Rub  Zyrtec  **AVOID if Problems With Blood Pressure         Heartburn: Avoid lying down for at least 1 hour after meals  Aciphex      Maalox     Rash:  Milk of Magnesia     Benadryl    Mylanta       1% Hydrocortisone Cream  Pepcid  Pepcid Complete   Sleep Aids:  Prevacid      Ambien   Prilosec       Benadryl  Rolaids       Chamomile Tea  Tums (Limit 4/day)     Unisom         Tylenol PM         Warm milk-add vanilla or  Hemorrhoids:       Sugar for taste  Anusol/Anusol H.C.  (RX: Analapram 2.5%)  Sugar Substitutes:  Hydrocortisone OTC     Ok in moderation  Preparation H      Tucks        Vaseline lotion applied to tissue with  wiping    Herpes:     Throat:  Acyclovir      Oragel  Famvir  Valtrex     Vaccines:         Flu Shot Leg Cramps:       *Gardasil  Benadryl      Hepatitis A         Hepatitis B Nasal Spray:         Pneumovax  Saline Nasal Spray     Polio Booster         Tetanus Nausea:       Tuberculosis test or PPD  Vitamin B6 25 mg TID   AVOID:    Dramamine      *Gardasil  Emetrol       Live Poliovirus  Ginger Root 250 mg QID    MMR (measles, mumps &  High Complex Carbs @ Bedtime    rebella)  Sea Bands-Accupressure    Varicella (Chickenpox)  Unisom 1/2 tab TID     *No known complications           If received before Pain:         Known pregnancy;   Darvocet       Resume series after  Lortab        Delivery  Percocet    Yeast:   Tramadol      Femstat  Tylenol 3      Gyne-lotrimin  Ultram       Monistat  Vicodin           MISC:         All Sunscreens           Hair Coloring/highlights          Insect Repellant's          (Including DEET)         Mystic Tans Breastfeeding  Choosing to breastfeed is one of the best decisions you can make for yourself and your baby. A change in hormones during pregnancy causes your breasts to make breast milk in your milk-producing glands. Hormones prevent breast milk from being released before your baby is born. They also prompt milk flow after birth. Once breastfeeding has begun, thoughts of your baby, as well as his or her sucking or crying, can stimulate the release of milk from your milk-producing glands. Benefits of breastfeeding Research shows that breastfeeding offers many health benefits for infants and mothers. It also offers a cost-free and convenient way to feed your baby. For your baby  Your first milk (colostrum) helps your baby's digestive system to function better.  Special cells in your milk (antibodies) help your baby to fight off infections.  Breastfed babies are less likely to develop asthma, allergies, obesity, or type 2 diabetes. They  are also at lower risk for sudden infant death syndrome (SIDS).  Nutrients in breast milk are better able to meet your baby's needs compared to infant formula.  Breast milk improves your baby's brain development. For you  Breastfeeding helps to create a very special bond between you and your baby.  Breastfeeding is convenient. Breast milk costs nothing and is always available at the correct temperature.  Breastfeeding helps to burn calories. It helps you to lose the weight that you gained during pregnancy.  Breastfeeding makes your uterus return faster to its size before pregnancy. It also slows bleeding (lochia) after you give birth.  Breastfeeding helps to lower your risk of developing type 2 diabetes, osteoporosis, rheumatoid arthritis, cardiovascular disease, and breast, ovarian, uterine, and endometrial cancer later in life. Breastfeeding basics Starting breastfeeding  Find a comfortable place to sit or lie down, with your neck and back well-supported.  Place a pillow or a rolled-up blanket under your baby to bring him or her to the level of your breast (if you are seated). Nursing pillows are specially designed to help support your arms and your baby while you  breastfeed.  Make sure that your baby's tummy (abdomen) is facing your abdomen.  Gently massage your breast. With your fingertips, massage from the outer edges of your breast inward toward the nipple. This encourages milk flow. If your milk flows slowly, you may need to continue this action during the feeding.  Support your breast with 4 fingers underneath and your thumb above your nipple (make the letter "C" with your hand). Make sure your fingers are well away from your nipple and your baby's mouth.  Stroke your baby's lips gently with your finger or nipple.  When your baby's mouth is open wide enough, quickly bring your baby to your breast, placing your entire nipple and as much of the areola as possible into your baby's  mouth. The areola is the colored area around your nipple. ? More areola should be visible above your baby's upper lip than below the lower lip. ? Your baby's lips should be opened and extended outward (flanged) to ensure an adequate, comfortable latch. ? Your baby's tongue should be between his or her lower gum and your breast.  Make sure that your baby's mouth is correctly positioned around your nipple (latched). Your baby's lips should create a seal on your breast and be turned out (everted).  It is common for your baby to suck about 2-3 minutes in order to start the flow of breast milk. Latching Teaching your baby how to latch onto your breast properly is very important. An improper latch can cause nipple pain, decreased milk supply, and poor weight gain in your baby. Also, if your baby is not latched onto your nipple properly, he or she may swallow some air during feeding. This can make your baby fussy. Burping your baby when you switch breasts during the feeding can help to get rid of the air. However, teaching your baby to latch on properly is still the best way to prevent fussiness from swallowing air while breastfeeding. Signs that your baby has successfully latched onto your nipple  Silent tugging or silent sucking, without causing you pain. Infant's lips should be extended outward (flanged).  Swallowing heard between every 3-4 sucks once your milk has started to flow (after your let-down milk reflex occurs).  Muscle movement above and in front of his or her ears while sucking. Signs that your baby has not successfully latched onto your nipple  Sucking sounds or smacking sounds from your baby while breastfeeding.  Nipple pain. If you think your baby has not latched on correctly, slip your finger into the corner of your baby's mouth to break the suction and place it between your baby's gums. Attempt to start breastfeeding again. Signs of successful breastfeeding Signs from your  baby  Your baby will gradually decrease the number of sucks or will completely stop sucking.  Your baby will fall asleep.  Your baby's body will relax.  Your baby will retain a small amount of milk in his or her mouth.  Your baby will let go of your breast by himself or herself. Signs from you  Breasts that have increased in firmness, weight, and size 1-3 hours after feeding.  Breasts that are softer immediately after breastfeeding.  Increased milk volume, as well as a change in milk consistency and color by the fifth day of breastfeeding.  Nipples that are not sore, cracked, or bleeding. Signs that your baby is getting enough milk  Wetting at least 1-2 diapers during the first 24 hours after birth.  Wetting at least 5-6  diapers every 24 hours for the first week after birth. The urine should be clear or pale yellow by the age of 5 days.  Wetting 6-8 diapers every 24 hours as your baby continues to grow and develop.  At least 3 stools in a 24-hour period by the age of 5 days. The stool should be soft and yellow.  At least 3 stools in a 24-hour period by the age of 7 days. The stool should be seedy and yellow.  No loss of weight greater than 10% of birth weight during the first 3 days of life.  Average weight gain of 4-7 oz (113-198 g) per week after the age of 4 days.  Consistent daily weight gain by the age of 5 days, without weight loss after the age of 2 weeks. After a feeding, your baby may spit up a small amount of milk. This is normal. Breastfeeding frequency and duration Frequent feeding will help you make more milk and can prevent sore nipples and extremely full breasts (breast engorgement). Breastfeed when you feel the need to reduce the fullness of your breasts or when your baby shows signs of hunger. This is called "breastfeeding on demand." Signs that your baby is hungry include:  Increased alertness, activity, or restlessness.  Movement of the head from side to  side.  Opening of the mouth when the corner of the mouth or cheek is stroked (rooting).  Increased sucking sounds, smacking lips, cooing, sighing, or squeaking.  Hand-to-mouth movements and sucking on fingers or hands.  Fussing or crying. Avoid introducing a pacifier to your baby in the first 4-6 weeks after your baby is born. After this time, you may choose to use a pacifier. Research has shown that pacifier use during the first year of a baby's life decreases the risk of sudden infant death syndrome (SIDS). Allow your baby to feed on each breast as long as he or she wants. When your baby unlatches or falls asleep while feeding from the first breast, offer the second breast. Because newborns are often sleepy in the first few weeks of life, you may need to awaken your baby to get him or her to feed. Breastfeeding times will vary from baby to baby. However, the following rules can serve as a guide to help you make sure that your baby is properly fed:  Newborns (babies 4 weeks of age or younger) may breastfeed every 1-3 hours.  Newborns should not go without breastfeeding for longer than 3 hours during the day or 5 hours during the night.  You should breastfeed your baby a minimum of 8 times in a 24-hour period. Breast milk pumping Pumping and storing breast milk allows you to make sure that your baby is exclusively fed your breast milk, even at times when you are unable to breastfeed. This is especially important if you go back to work while you are still breastfeeding, or if you are not able to be present during feedings. Your lactation consultant can help you find a method of pumping that works best for you and give you guidelines about how long it is safe to store breast milk.      Caring for your breasts while you breastfeed Nipples can become dry, cracked, and sore while breastfeeding. The following recommendations can help keep your breasts moisturized and healthy:  Avoid using soap on  your nipples.  Wear a supportive bra designed especially for nursing. Avoid wearing underwire-style bras or extremely tight bras (sports bras).  Air-dry   your nipples for 3-4 minutes after each feeding.  Use only cotton bra pads to absorb leaked breast milk. Leaking of breast milk between feedings is normal.  Use lanolin on your nipples after breastfeeding. Lanolin helps to maintain your skin's normal moisture barrier. Pure lanolin is not harmful (not toxic) to your baby. You may also hand express a few drops of breast milk and gently massage that milk into your nipples and allow the milk to air-dry. In the first few weeks after giving birth, some women experience breast engorgement. Engorgement can make your breasts feel heavy, warm, and tender to the touch. Engorgement peaks within 3-5 days after you give birth. The following recommendations can help to ease engorgement:  Completely empty your breasts while breastfeeding or pumping. You may want to start by applying warm, moist heat (in the shower or with warm, water-soaked hand towels) just before feeding or pumping. This increases circulation and helps the milk flow. If your baby does not completely empty your breasts while breastfeeding, pump any extra milk after he or she is finished.  Apply ice packs to your breasts immediately after breastfeeding or pumping, unless this is too uncomfortable for you. To do this: ? Put ice in a plastic bag. ? Place a towel between your skin and the bag. ? Leave the ice on for 20 minutes, 2-3 times a day.  Make sure that your baby is latched on and positioned properly while breastfeeding. If engorgement persists after 48 hours of following these recommendations, contact your health care provider or a Science writer. Overall health care recommendations while breastfeeding  Eat 3 healthy meals and 3 snacks every day. Well-nourished mothers who are breastfeeding need an additional 450-500 calories a day.  You can meet this requirement by increasing the amount of a balanced diet that you eat.  Drink enough water to keep your urine pale yellow or clear.  Rest often, relax, and continue to take your prenatal vitamins to prevent fatigue, stress, and low vitamin and mineral levels in your body (nutrient deficiencies).  Do not use any products that contain nicotine or tobacco, such as cigarettes and e-cigarettes. Your baby may be harmed by chemicals from cigarettes that pass into breast milk and exposure to secondhand smoke. If you need help quitting, ask your health care provider.  Avoid alcohol.  Do not use illegal drugs or marijuana.  Talk with your health care provider before taking any medicines. These include over-the-counter and prescription medicines as well as vitamins and herbal supplements. Some medicines that may be harmful to your baby can pass through breast milk.  It is possible to become pregnant while breastfeeding. If birth control is desired, ask your health care provider about options that will be safe while breastfeeding your baby. Where to find more information: [employer]: www.llli.org Contact a health care provider if:  You feel like you want to stop breastfeeding or have become frustrated with breastfeeding.  Your nipples are cracked or bleeding.  Your breasts are red, tender, or warm.  You have: ? Painful breasts or nipples. ? A swollen area on either breast. ? A fever or chills. ? Nausea or vomiting. ? Drainage other than breast milk from your nipples.  Your breasts do not become full before feedings by the fifth day after you give birth.  You feel sad and depressed.  Your baby is: ? Too sleepy to eat well. ? Having trouble sleeping. ? More than 66 week old and wetting fewer than  6 diapers in a 24-hour period. ? Not gaining weight by 17 days of age.  Your baby has fewer than 3 stools in a 24-hour period.  Your baby's skin or the white  parts of his or her eyes become yellow. Get help right away if:  Your baby is overly tired (lethargic) and does not want to wake up and feed.  Your baby develops an unexplained fever. Summary  Breastfeeding offers many health benefits for infant and mothers.  Try to breastfeed your infant when he or she shows early signs of hunger.  Gently tickle or stroke your baby's lips with your finger or nipple to allow the baby to open his or her mouth. Bring the baby to your breast. Make sure that much of the areola is in your baby's mouth. Offer one side and burp the baby before you offer the other side.  Talk with your health care provider or lactation consultant if you have questions or you face problems as you breastfeed. This information is not intended to replace advice given to you by your health care provider. Make sure you discuss any questions you have with your health care provider. Document Revised: 02/09/2018 Document Reviewed: 12/17/2016 Elsevier Patient Education  2021 [employer].

## 2021-02-05 NOTE — Progress Notes (Signed)
OB-Pt present for routine prenatal care. Pt stated having round ligament pain.

## 2021-02-05 NOTE — Progress Notes (Signed)
Pt to be d/c home and to self care. Pt given teaching on when to seek medical attention (LOF, VB and decreased FM, etc..). Pt verbalized understanding of d/c instructions.   ?

## 2021-02-05 NOTE — Progress Notes (Signed)
ROB: Patient doing well, no complaints. Has questions regarding use of a doula, and other general questions.  Still on the fence about breastfeeding.  Given information on breastfeeding and doula. For 28 week labs today.  Desires unsure method for contraception, has tried multiple options in the past that did not work for her. For Tdap today, signed blood consent. RTC in 2 weeks.

## 2021-02-06 LAB — CBC
Hematocrit: 32.4 % — ABNORMAL LOW (ref 34.0–46.6)
Hemoglobin: 10.7 g/dL — ABNORMAL LOW (ref 11.1–15.9)
MCH: 31.5 pg (ref 26.6–33.0)
MCHC: 33 g/dL (ref 31.5–35.7)
MCV: 95 fL (ref 79–97)
Platelets: 279 10*3/uL (ref 150–450)
RBC: 3.4 x10E6/uL — ABNORMAL LOW (ref 3.77–5.28)
RDW: 12.5 % (ref 11.7–15.4)
WBC: 10 10*3/uL (ref 3.4–10.8)

## 2021-02-06 LAB — RPR: RPR Ser Ql: NONREACTIVE

## 2021-02-06 LAB — GLUCOSE, 1 HOUR GESTATIONAL: Gestational Diabetes Screen: 135 mg/dL (ref 65–139)

## 2021-02-10 NOTE — Discharge Summary (Signed)
    L&D OB Triage Note  SUBJECTIVE Emily Bullock is a 25 y.o. G1P0 female at 103w0d, EDD Estimated Date of Delivery: 05/05/21 who presented to triage with complaints of decreased fetal movement since her 1 hour GCT today.   Denies contractions, leakage of fluid or vaginal bleeding.  OB History  Gravida Para Term Preterm AB Living  1 0 0 0 0 0  SAB IAB Ectopic Multiple Live Births  0 0 0 0 0    # Outcome Date GA Lbr Len/2nd Weight Sex Delivery Anes PTL Lv  1 Current             No medications prior to admission.     OBJECTIVE  Nursing Evaluation:   BP 119/74 (BP Location: Left Arm)   Pulse 93   Temp 98.4 F (36.9 C) (Oral)   Resp 16   Ht 5\' 2"  (1.575 m)   Wt 91.2 kg   LMP 07/16/2020   BMI 36.76 kg/m    Findings:        Very active fetal movement noted     Fetal heart monitoring reassuring.      NST was performed and has been reviewed by me.  NST INTERPRETATION: Category I  Mode: External Baseline Rate (A): 150 bpm Variability: Moderate Accelerations: 15 x 15 Decelerations: None     Contraction Frequency (min): ui  ASSESSMENT Impression:  1.  Pregnancy:  G1P0 at [redacted]w[redacted]d , EDD Estimated Date of Delivery: 05/05/21 2.  Reassuring fetal and maternal status   PLAN 1. Discussed current condition and above findings with patient and reassurance given.  All questions answered. 2. Discharge home with standard labor precautions given to return to L&D or call the office for problems. 3. Continue routine prenatal care.

## 2021-02-19 ENCOUNTER — Encounter: Payer: Self-pay | Admitting: Obstetrics and Gynecology

## 2021-02-19 ENCOUNTER — Other Ambulatory Visit: Payer: Self-pay

## 2021-02-19 ENCOUNTER — Ambulatory Visit (INDEPENDENT_AMBULATORY_CARE_PROVIDER_SITE_OTHER): Payer: Medicaid Other | Admitting: Obstetrics and Gynecology

## 2021-02-19 VITALS — BP 101/68 | HR 101 | Wt 201.6 lb

## 2021-02-19 DIAGNOSIS — Z3402 Encounter for supervision of normal first pregnancy, second trimester: Secondary | ICD-10-CM

## 2021-02-19 DIAGNOSIS — Z3A29 29 weeks gestation of pregnancy: Secondary | ICD-10-CM

## 2021-02-19 LAB — POCT URINALYSIS DIPSTICK OB
Bilirubin, UA: NEGATIVE
Blood, UA: NEGATIVE
Glucose, UA: NEGATIVE
Ketones, UA: NEGATIVE
Leukocytes, UA: NEGATIVE
Nitrite, UA: NEGATIVE
POC,PROTEIN,UA: NEGATIVE
Spec Grav, UA: 1.01 (ref 1.010–1.025)
Urobilinogen, UA: 0.2 E.U./dL
pH, UA: 7 (ref 5.0–8.0)

## 2021-02-19 NOTE — Progress Notes (Signed)
ROB: Patient has no complaints.  Lab work reviewed.  Feeling daily fetal movement.  Has not pursued Dula yet.

## 2021-03-04 ENCOUNTER — Telehealth: Payer: Self-pay | Admitting: Obstetrics and Gynecology

## 2021-03-04 NOTE — Telephone Encounter (Signed)
Spoke to pt and informed her that braxton hick contractions are normal. Pt was given information on braxton hick contractions and important warnings signs. Pt voiced that she understood.

## 2021-03-04 NOTE — Telephone Encounter (Signed)
Patient called in and states she is 31 weeks.  Patient states she is having braxton hicks contractions and they are not consistent.  Patient states her mother was "freaking out" and states it is not normal to have braxton hicks this early.  Patient would like to speak to a nurse to ease her mother.  Please advise, confirmed call back number is [phone].

## 2021-03-12 ENCOUNTER — Other Ambulatory Visit: Payer: Self-pay

## 2021-03-12 ENCOUNTER — Encounter: Payer: Self-pay | Admitting: Obstetrics and Gynecology

## 2021-03-12 ENCOUNTER — Ambulatory Visit (INDEPENDENT_AMBULATORY_CARE_PROVIDER_SITE_OTHER): Payer: Medicaid Other | Admitting: Obstetrics and Gynecology

## 2021-03-12 VITALS — BP 132/84 | HR 105 | Ht 62.0 in | Wt 204.0 lb

## 2021-03-12 DIAGNOSIS — Z3A32 32 weeks gestation of pregnancy: Secondary | ICD-10-CM

## 2021-03-12 DIAGNOSIS — Z3403 Encounter for supervision of normal first pregnancy, third trimester: Secondary | ICD-10-CM

## 2021-03-12 DIAGNOSIS — O479 False labor, unspecified: Secondary | ICD-10-CM

## 2021-03-12 LAB — POCT URINALYSIS DIPSTICK OB
Bilirubin, UA: NEGATIVE
Blood, UA: NEGATIVE
Glucose, UA: NEGATIVE
Ketones, UA: NEGATIVE
Nitrite, UA: NEGATIVE
POC,PROTEIN,UA: NEGATIVE
Spec Grav, UA: 1.01 (ref 1.010–1.025)
Urobilinogen, UA: 0.2 E.U./dL
pH, UA: 7 (ref 5.0–8.0)

## 2021-03-12 NOTE — Progress Notes (Signed)
ROB: Notes some Braxton Hicks contractions and pelvic pressure. Discussed third trimester of pregnancy and expectations.  RTC in 2 weeks.

## 2021-03-12 NOTE — Progress Notes (Signed)
OB-Pt present for routine prenatal care. Pt stated having braxton hick contractions and lower abd pain/pressure.

## 2021-03-12 NOTE — Patient Instructions (Addendum)
WHAT OB PATIENTS CAN EXPECT   Confirmation of pregnancy and ultrasound ordered if medically indicated-[redacted] weeks gestation  New OB (NOB) intake with nurse and New OB (NOB) labs- [redacted] weeks gestation  New OB (NOB) physical examination with provider- 11/[redacted] weeks gestation  Flu vaccine-[redacted] weeks gestation  Anatomy scan-[redacted] weeks gestation  Glucose tolerance test, blood work to test for anemia, T-dap vaccine-[redacted] weeks gestation  Vaginal swabs/cultures-STD/Group B strep-[redacted] weeks gestation  Appointments every 4 weeks until 28 weeks  Every 2 weeks from 28 weeks until 36 weeks  Weekly visits from 36 weeks until delivery  Common Medications Safe in Pregnancy  Acne:      Constipation:  Benzoyl Peroxide     Colace  Clindamycin      Dulcolax Suppository  Topica Erythromycin     Fibercon  Salicylic Acid      Metamucil         Miralax AVOID:        Senakot   Accutane    Cough:  Retin-A       Cough Drops  Tetracycline      Phenergan w/ Codeine if Rx  Minocycline      Robitussin (Plain & DM)  Antibiotics:     Crabs/Lice:  Ceclor       RID  Cephalosporins    AVOID:  E-Mycins      Kwell  Keflex  Macrobid/Macrodantin   Diarrhea:  Penicillin      Kao-Pectate  Zithromax      Imodium AD         PUSH FLUIDS AVOID:       Cipro     Fever:  Tetracycline      Tylenol (Regular or Extra  Minocycline       Strength)  Levaquin      Extra Strength-Do not          Exceed 8 tabs/24 hrs Caffeine:        <256m/day (equiv. To 1 cup of coffee or  approx. 3 12 oz sodas)         Gas: Cold/Hayfever:       Gas-X  Benadryl      Mylicon  Claritin       Phazyme  **Claritin-D        Chlor-Trimeton    Headaches:  Dimetapp      ASA-Free Excedrin  Drixoral-Non-Drowsy     Cold Compress  Mucinex (Guaifenasin)     Tylenol (Regular or Extra  Sudafed/Sudafed-12 Hour     Strength)  **Sudafed PE Pseudoephedrine   Tylenol Cold & Sinus     Vicks Vapor Rub  Zyrtec  **AVOID if Problems With Blood  Pressure         Heartburn: Avoid lying down for at least 1 hour after meals  Aciphex      Maalox     Rash:  Milk of Magnesia     Benadryl    Mylanta       1% Hydrocortisone Cream  Pepcid  Pepcid Complete   Sleep Aids:  Prevacid      Ambien   Prilosec       Benadryl  Rolaids       Chamomile Tea  Tums (Limit 4/day)     Unisom         Tylenol PM         Warm milk-add vanilla or  Hemorrhoids:       Sugar for taste  Anusol/Anusol H.C.  (RX: Analapram 2.5%)  Sugar Substitutes:  Hydrocortisone OTC     Ok in moderation  Preparation H      Tucks        Vaseline lotion applied to tissue with wiping    Herpes:     Throat:  Acyclovir      Oragel  Famvir  Valtrex     Vaccines:         Flu Shot Leg Cramps:       *Gardasil  Benadryl      Hepatitis A         Hepatitis B Nasal Spray:       Pneumovax  Saline Nasal Spray     Polio Booster         Tetanus Nausea:       Tuberculosis test or PPD  Vitamin B6 25 mg TID   AVOID:    Dramamine      *Gardasil  Emetrol       Live Poliovirus  Ginger Root 250 mg QID    MMR (measles, mumps &  High Complex Carbs @ Bedtime    rebella)  Sea Bands-Accupressure    Varicella (Chickenpox)  Unisom 1/2 tab TID     *No known complications           If received before Pain:         Known pregnancy;   Darvocet       Resume series after  Lortab        Delivery  Percocet    Yeast:   Tramadol      Femstat  Tylenol 3      Gyne-lotrimin  Ultram       Monistat  Vicodin           MISC:         All Sunscreens           Hair Coloring/highlights          Insect Repellant's          (Including DEET)         Mystic Tans     [email or web address]">  Third Trimester of Pregnancy  The third trimester of pregnancy is from week 28 through week 40. This is months 7 through 9. The third trimester is a time when the unborn baby (fetus) is growing rapidly. At the end of the ninth month, the fetus is about 20  inches long and weighs 6-10 pounds. Body changes during your third trimester During the third trimester, your body will continue to go through many changes. The changes vary and generally return to normal after your baby is born. Physical changes  Your weight will continue to increase. You can expect to gain 25-35 pounds (11-16 kg) by the end of the pregnancy if you begin pregnancy at a normal weight. If you are underweight, you can expect to gain 28-40 lb (about 13-18 kg), and if you are overweight, you can expect to gain 15-25 lb (about 7-11 kg).  You may begin to get stretch marks on your hips, abdomen, and breasts.  Your breasts will continue to grow and may hurt. A yellow fluid (colostrum) may leak from your breasts. This is the first milk you are producing for your baby.  You may have changes in your hair. These can include thickening of your hair, rapid growth, and changes in texture. Some people also have hair loss during or after pregnancy, or hair that feels dry or thin.  Your belly button may stick out.  You may notice more swelling in your hands, face, or ankles. Health changes  You may have heartburn.  You may have constipation.  You may develop hemorrhoids.  You may develop swollen, bulging veins (varicose veins) in your legs.  You may have increased body aches in the pelvis, back, or thighs. This is due to weight gain and increased hormones that are relaxing your joints.  You may have increased tingling or numbness in your hands, arms, and legs. The skin on your abdomen may also feel numb.  You may feel short of breath because of your expanding uterus. Other changes  You may urinate more often because the fetus is moving lower into your pelvis and pressing on your bladder.  You may have more problems sleeping. This may be caused by the size of your abdomen, an increased need to urinate, and an increase in your body's metabolism.  You may notice the fetus  "dropping," or moving lower in your abdomen (lightening).  You may have increased vaginal discharge.  You may notice that you have pain around your pelvic bone as your uterus distends. Follow these instructions at home: Medicines  Follow your health care provider's instructions regarding medicine use. Specific medicines may be either safe or unsafe to take during pregnancy. Do not take any medicines unless approved by your health care provider.  Take a prenatal vitamin that contains at least 600 micrograms (mcg) of folic acid. Eating and drinking  Eat a healthy diet that includes fresh fruits and vegetables, whole grains, good sources of protein such as meat, eggs, or tofu, and low-fat dairy products.  Avoid raw meat and unpasteurized juice, milk, and cheese. These carry germs that can harm you and your baby.  Eat 4 or 5 small meals rather than 3 large meals a day.  You may need to take these actions to prevent or treat constipation: ? Drink enough fluid to keep your urine pale yellow. ? Eat foods that are high in fiber, such as beans, whole grains, and fresh fruits and vegetables. ? Limit foods that are high in fat and processed sugars, such as fried or sweet foods. Activity  Exercise only as directed by your health care provider. Most people can continue their usual exercise routine during pregnancy. Try to exercise for 30 minutes at least 5 days a week. Stop exercising if you experience contractions in the uterus.  Stop exercising if you develop pain or cramping in the lower abdomen or lower back.  Avoid heavy lifting.  Do not exercise if it is very hot or humid or if you are at a high altitude.  If you choose to, you may continue to have sex unless your health care provider tells you not to. Relieving pain and discomfort  Take frequent breaks and rest with your legs raised (elevated) if you have leg cramps or low back pain.  Take warm sitz baths to soothe any pain or  discomfort caused by hemorrhoids. Use hemorrhoid cream if your health care provider approves.  Wear a supportive bra to prevent discomfort from breast tenderness.  If you develop varicose veins: ? Wear support hose as told by your health care provider. ? Elevate your feet for 15 minutes, 3-4 times a day. ? Limit salt in your diet. Safety  Talk to your health care provider before traveling far distances.  Do not use hot tubs, steam rooms, or saunas.  Wear your seat belt at all times when driving or riding in a car.  Talk with your health care provider if someone is verbally or physically abusive to you. Preparing for birth To prepare for the arrival of your baby:  Take prenatal classes to understand, practice, and ask questions about labor and delivery.  Visit the hospital and tour the maternity area.  Purchase a rear-facing car seat and make sure you know how to install it in your car.  Prepare the baby's room or sleeping area. Make sure to remove all pillows and stuffed animals from the baby's crib to prevent suffocation. General instructions  Avoid cat litter boxes and soil used by cats. These carry germs that can cause birth defects in the baby. If you have a cat, ask someone to clean the litter box for you.  Do not douche or use tampons. Do not use scented sanitary pads.  Do not use any products that contain nicotine or tobacco, such as cigarettes, e-cigarettes, and chewing tobacco. If you need help quitting, ask your health care provider.  Do not use any herbal remedies, illegal drugs, or medicines that were not prescribed to you. Chemicals in these products can harm your baby.  Do not drink alcohol.  You will have more frequent prenatal exams during the third trimester. During a routine prenatal visit, your health care provider will do a physical exam, perform tests, and discuss your overall health. Keep all follow-up visits. This is important. Where to find more  information  American Pregnancy Association: americanpregnancy.[place] and Gynecologists: PoolDevices.com.pt  Office on [employer]: KeywordPortfolios.com.br Contact a health care provider if you have:  A fever.  Mild pelvic cramps, pelvic pressure, or nagging pain in your abdominal area or lower back.  Vomiting or diarrhea.  Bad-smelling vaginal discharge or foul-smelling urine.  Pain when you urinate.  A headache that does not go away when you take medicine.  Visual changes or see spots in front of your eyes. Get help right away if:  Your water breaks.  You have regular contractions less than 5 minutes apart.  You have spotting or bleeding from your vagina.  You have severe abdominal pain.  You have difficulty breathing.  You have chest pain.  You have fainting spells.  You have not felt your baby move for the time period told by your health care provider.  You have new or increased pain, swelling, or redness in an arm or leg. Summary  The third trimester of pregnancy is from week 28 through week 40 (months 7 through 9).  You may have more problems sleeping. This can be caused by the size of your abdomen, an increased need to urinate, and an increase in your body's metabolism.  You will have more frequent prenatal exams during the third trimester. Keep all follow-up visits. This is important. This information is not intended to replace advice given to you by your health care provider. Make sure you discuss any questions you have with your health care provider. Document Revised: 04/23/2020 Document Reviewed: 02/28/2020 Elsevier Patient Education  2021 [employer].

## 2021-03-26 ENCOUNTER — Encounter: Payer: Self-pay | Admitting: Obstetrics and Gynecology

## 2021-03-26 ENCOUNTER — Other Ambulatory Visit: Payer: Self-pay

## 2021-03-26 ENCOUNTER — Ambulatory Visit (INDEPENDENT_AMBULATORY_CARE_PROVIDER_SITE_OTHER): Payer: Medicaid Other | Admitting: Obstetrics and Gynecology

## 2021-03-26 VITALS — BP 114/76 | HR 82 | Wt 205.8 lb

## 2021-03-26 DIAGNOSIS — Z0289 Encounter for other administrative examinations: Secondary | ICD-10-CM

## 2021-03-26 DIAGNOSIS — Z3A34 34 weeks gestation of pregnancy: Secondary | ICD-10-CM

## 2021-03-26 DIAGNOSIS — Z3403 Encounter for supervision of normal first pregnancy, third trimester: Secondary | ICD-10-CM

## 2021-03-26 LAB — POCT URINALYSIS DIPSTICK OB
Bilirubin, UA: NEGATIVE
Blood, UA: NEGATIVE
Glucose, UA: NEGATIVE
Ketones, UA: NEGATIVE
Leukocytes, UA: NEGATIVE
Nitrite, UA: NEGATIVE
POC,PROTEIN,UA: NEGATIVE
Spec Grav, UA: 1.015 (ref 1.010–1.025)
Urobilinogen, UA: 0.2 E.U./dL
pH, UA: 7 (ref 5.0–8.0)

## 2021-03-26 NOTE — Progress Notes (Signed)
ROB: Patient says she is "over it".  Having hard time at school, having hard time work because she is still tired.  Active fetal movement today.  GC/CT-GBS next visit.

## 2021-04-08 ENCOUNTER — Ambulatory Visit (INDEPENDENT_AMBULATORY_CARE_PROVIDER_SITE_OTHER): Payer: Medicaid Other | Admitting: Obstetrics and Gynecology

## 2021-04-08 ENCOUNTER — Other Ambulatory Visit: Payer: Self-pay

## 2021-04-08 ENCOUNTER — Encounter: Payer: Self-pay | Admitting: Obstetrics and Gynecology

## 2021-04-08 VITALS — BP 133/83 | HR 99 | Wt 204.2 lb

## 2021-04-08 DIAGNOSIS — Z3A36 36 weeks gestation of pregnancy: Secondary | ICD-10-CM

## 2021-04-08 DIAGNOSIS — Z3403 Encounter for supervision of normal first pregnancy, third trimester: Secondary | ICD-10-CM

## 2021-04-08 DIAGNOSIS — Z3685 Encounter for antenatal screening for Streptococcus B: Secondary | ICD-10-CM

## 2021-04-08 LAB — POCT URINALYSIS DIPSTICK OB
Bilirubin, UA: NEGATIVE
Blood, UA: NEGATIVE
Glucose, UA: NEGATIVE
Ketones, UA: NEGATIVE
Leukocytes, UA: NEGATIVE
Nitrite, UA: NEGATIVE
POC,PROTEIN,UA: NEGATIVE
Spec Grav, UA: 1.01 (ref 1.010–1.025)
Urobilinogen, UA: 0.2 E.U./dL
pH, UA: 7.5 (ref 5.0–8.0)

## 2021-04-08 NOTE — Patient Instructions (Addendum)
Signs and Symptoms of Labor Labor is the body's natural process of moving the baby and the placenta out of the uterus. The process of labor usually starts when the baby is full-term, between 37 and 40 weeks of pregnancy. Signs and symptoms that you are close to going into labor As your body prepares for labor and the birth of your baby, you may notice the following symptoms in the weeks and days before true labor starts:  Passing a small amount of thick, bloody mucus from your vagina. This is called normal bloody show or losing your mucus plug. This may happen more than a week before labor begins, or right before labor begins, as the opening of the cervix starts to widen (dilate). For some women, the entire mucus plug passes at once. For others, pieces of the mucus plug may gradually pass over several days.  Your baby moving (dropping) lower in your pelvis to get into position for birth (lightening). When this happens, you may feel more pressure on your bladder and pelvic bone and less pressure on your ribs. This may make it easier to breathe. It may also cause you to need to urinate more often and have problems with bowel movements.  Having "practice contractions," also called Braxton Hicks contractions or false labor. These occur at irregular (unevenly spaced) intervals that are more than 10 minutes apart. False labor contractions are common after exercise or sexual activity. They will stop if you change position, rest, or drink fluids. These contractions are usually mild and do not get stronger over time. They may feel like: ? A backache or back pain. ? Mild cramps, similar to menstrual cramps. ? Tightening or pressure in your abdomen. Other early symptoms include:  Nausea or loss of appetite.  Diarrhea.  Having a sudden burst of energy, or feeling very tired.  Mood changes.  Having trouble sleeping.   Signs and symptoms that labor has begun Signs that you are in labor may  include:  Having contractions that come at regular (evenly spaced) intervals and increase in intensity. This may feel like more intense tightening or pressure in your abdomen that moves to your back. ? Contractions may also feel like rhythmic pain in your upper thighs or back that comes and goes at regular intervals. ? For first-time mothers, this change in intensity of contractions often occurs at a more gradual pace. ? Women who have given birth before may notice a more rapid progression of contraction changes.  Feeling pressure in the vaginal area.  Your water breaking (rupture of membranes). This is when the sac of fluid that surrounds your baby breaks. Fluid leaking from your vagina may be clear or blood-tinged. Labor usually starts within 24 hours of your water breaking, but it may take longer to begin. ? Some women may feel a sudden gush of fluid. ? Others notice that their underwear repeatedly becomes damp. Follow these instructions at home:  When labor starts, or if your water breaks, call your health care provider or nurse care line. Based on your situation, they will determine when you should go in for an exam.  During early labor, you may be able to rest and manage symptoms at home. Some strategies to try at home include: ? Breathing and relaxation techniques. ? Taking a warm bath or shower. ? Listening to music. ? Using a heating pad on the lower back for pain. If you are directed to use heat:  Place a towel between your skin and the   heat source.  Leave the heat on for 20-30 minutes.  Remove the heat if your skin turns bright red. This is especially important if you are unable to feel pain, heat, or cold. You may have a greater risk of getting burned.   Contact a health care provider if:  Your labor has started.  Your water breaks. Get help right away if:  You have painful, regular contractions that are 5 minutes apart or less.  Labor starts before you are [redacted] weeks  along in your pregnancy.  You have a fever.  You have bright red blood coming from your vagina.  You do not feel your baby moving.  You have a severe headache with or without vision problems.  You have severe nausea, vomiting, or diarrhea.  You have chest pain or shortness of breath. These symptoms may represent a serious problem that is an emergency. Do not wait to see if the symptoms will go away. Get medical help right away. Call your local emergency services (911 in the [place] Do not drive yourself to the hospital. Summary  Labor is your body's natural process of moving your baby and the placenta out of your uterus.  The process of labor usually starts when your baby is full-term, between 18 and 40 weeks of pregnancy.  When labor starts, or if your water breaks, call your health care provider or nurse care line. Based on your situation, they will determine when you should go in for an exam. This information is not intended to replace advice given to you by your health care provider. Make sure you discuss any questions you have with your health care provider. Document Revised: 09/06/2020 Document Reviewed: 09/06/2020 Elsevier Patient Education  2021 Elsevier Inc.   Group B Streptococcus Test During Pregnancy Why am I having this test? Routine testing, also called screening, for group B streptococcus (GBS) is recommended for all pregnant women between the 36th and 37th week of pregnancy. GBS is a type of bacteria that can be passed from mother to baby during childbirth. Screening will help guide whether or not you will need treatment during labor and delivery to prevent complications such as:  An infection in your uterus during labor.  An infection in your uterus after delivery.  A serious infection in your baby after delivery, such as pneumonia, meningitis, or sepsis. GBS screening is not often done before 36 weeks of pregnancy unless you go into labor prematurely. What  happens if I have group B streptococcus? If testing shows that you have GBS, your health care provider will recommend treatment with IV antibiotics during labor and delivery. This treatment significantly decreases the risk of complications for you and your baby. If you have a planned C-section and you have GBS, you may not need to be treated with antibiotics because GBS is usually passed to babies after labor starts and your water breaks. If you are in labor or your water breaks before your C-section, it is possible for GBS to get into your uterus and be passed to your baby, so you might need treatment. Is there a chance I may not need to be tested? You may not need to be tested for GBS if:  You have a urine test that shows GBS before 36 to 37 weeks.  You had a baby with GBS infection after a previous delivery. In these cases, you will automatically be treated for GBS during labor and delivery. What is being tested? This test is done to check  if you have group B streptococcus in your vagina or rectum. What kind of sample is taken? To collect samples for this test, your health care provider will swab your vagina and rectum with a cotton swab. The sample is then sent to the lab to see if GBS is present. What happens during the test?  You will remove your clothing from the waist down.  You will lie down on an exam table in the same position as you would for a pelvic exam.  Your health care provider will swab your vagina and rectum to collect samples for a culture test.  You will be able to go home after the test and do all your usual activities.   How are the results reported? The test results are reported as positive or negative. What do the results mean?  A positive test means you are at risk for passing GBS to your baby during labor and delivery. Your health care provider will recommend that you are treated with an IV antibiotic during labor and delivery.  A negative test means you are  at very low risk of passing GBS to your baby. There is still a low risk of passing GBS to your baby because sometimes test results may report that you do not have a condition when you do (false-negative result) or there is a chance that you may become infected with GBS after the test is done. You most likely will not need to be treated with an antibiotic during labor and delivery. Talk with your health care provider about what your results mean. Questions to ask your health care provider Ask your health care provider, or the department that is doing the test:  When will my results be ready?  How will I get my results?  What are my treatment options? Summary  Routine testing (screening) for group B streptococcus (GBS) is recommended for all pregnant women between the 36th and 37th week of pregnancy.  GBS is a type of bacteria that can be passed from mother to baby during childbirth.  If testing shows that you have GBS, your health care provider will recommend that you are treated with IV antibiotics during labor and delivery. This treatment almost always prevents infection in newborns. This information is not intended to replace advice given to you by your health care provider. Make sure you discuss any questions you have with your health care provider. Document Revised: 09/16/2020 Document Reviewed: 12/13/2018 Elsevier Patient Education  2021 [employer].

## 2021-04-08 NOTE — Progress Notes (Signed)
ROB: Patient noting more pressure.  36 week cultures performed today.  Discussed signs/symptoms of labor. RTC in 1 week.

## 2021-04-08 NOTE — Progress Notes (Signed)
OB-pt present for routine prenatal care and 36 week cultures. Pt stated having a lot of pressure in the vaginal area and pelvic pain.

## 2021-04-09 DIAGNOSIS — Z3403 Encounter for supervision of normal first pregnancy, third trimester: Secondary | ICD-10-CM | POA: Diagnosis not present

## 2021-04-12 LAB — GC/CHLAMYDIA PROBE AMP
Chlamydia trachomatis, NAA: NEGATIVE
Neisseria Gonorrhoeae by PCR: NEGATIVE

## 2021-04-12 LAB — STREP GP B NAA: Strep Gp B NAA: NEGATIVE

## 2021-04-15 ENCOUNTER — Other Ambulatory Visit: Payer: Self-pay

## 2021-04-15 ENCOUNTER — Encounter: Payer: Medicaid Other | Admitting: Obstetrics and Gynecology

## 2021-04-15 ENCOUNTER — Ambulatory Visit (INDEPENDENT_AMBULATORY_CARE_PROVIDER_SITE_OTHER): Payer: Medicaid Other | Admitting: Obstetrics and Gynecology

## 2021-04-15 ENCOUNTER — Encounter: Payer: Self-pay | Admitting: Obstetrics and Gynecology

## 2021-04-15 VITALS — BP 114/78 | HR 98 | Wt 201.6 lb

## 2021-04-15 DIAGNOSIS — Z3A37 37 weeks gestation of pregnancy: Secondary | ICD-10-CM

## 2021-04-15 DIAGNOSIS — Z3403 Encounter for supervision of normal first pregnancy, third trimester: Secondary | ICD-10-CM

## 2021-04-15 LAB — POCT URINALYSIS DIPSTICK OB
Bilirubin, UA: NEGATIVE
Blood, UA: NEGATIVE
Glucose, UA: NEGATIVE
Ketones, UA: NEGATIVE
Leukocytes, UA: NEGATIVE
Nitrite, UA: NEGATIVE
POC,PROTEIN,UA: NEGATIVE
Spec Grav, UA: 1.01 (ref 1.010–1.025)
Urobilinogen, UA: 0.2 E.U./dL
pH, UA: 5 (ref 5.0–8.0)

## 2021-04-15 NOTE — Progress Notes (Signed)
ROB: Continues to experience occasional heartburn.  Not using Tums-discussed antacids.  Has had diarrhea for 3 days 2-3 times per day.  Have recommended fiber intake as well as Imodium A-D if diarrhea becoming worse.  Also strongly recommended increase fluid intake.

## 2021-04-22 ENCOUNTER — Ambulatory Visit (INDEPENDENT_AMBULATORY_CARE_PROVIDER_SITE_OTHER): Payer: Medicaid Other | Admitting: Obstetrics and Gynecology

## 2021-04-22 ENCOUNTER — Encounter: Payer: Self-pay | Admitting: Obstetrics and Gynecology

## 2021-04-22 ENCOUNTER — Other Ambulatory Visit: Payer: Self-pay

## 2021-04-22 VITALS — BP 124/84 | HR 89 | Ht 62.0 in | Wt 205.8 lb

## 2021-04-22 DIAGNOSIS — Z3A38 38 weeks gestation of pregnancy: Secondary | ICD-10-CM

## 2021-04-22 DIAGNOSIS — Z3403 Encounter for supervision of normal first pregnancy, third trimester: Secondary | ICD-10-CM

## 2021-04-22 LAB — POCT URINALYSIS DIPSTICK OB
Bilirubin, UA: NEGATIVE
Blood, UA: NEGATIVE
Glucose, UA: NEGATIVE
Ketones, UA: NEGATIVE
Leukocytes, UA: NEGATIVE
Nitrite, UA: NEGATIVE
POC,PROTEIN,UA: NEGATIVE
Spec Grav, UA: <=1.005 — AB (ref 1.010–1.025)
Urobilinogen, UA: 0.2 E.U./dL
pH, UA: 7 (ref 5.0–8.0)

## 2021-04-22 NOTE — Progress Notes (Signed)
OB-Pt present for routine prenatal care. Pt stated having lower abd pain, pelvic pain, and back pain.

## 2021-04-22 NOTE — Progress Notes (Signed)
ROB: Patient noting pelvic pain/back pain.  Discussed cord blood banking at patient's partner's request.  Discussed labor precautions. Discussed birth plan. Given handouts. RTC in 1 week.

## 2021-04-29 ENCOUNTER — Other Ambulatory Visit: Payer: Self-pay

## 2021-04-29 ENCOUNTER — Observation Stay (HOSPITAL_BASED_OUTPATIENT_CLINIC_OR_DEPARTMENT_OTHER)
Admission: EM | Admit: 2021-04-29 | Discharge: 2021-04-30 | Disposition: A | Discharge status: Home / Self Care | Payer: Medicaid Other | Source: Home / Self Care | Admitting: Obstetrics and Gynecology

## 2021-04-29 ENCOUNTER — Encounter: Payer: Self-pay | Admitting: Obstetrics and Gynecology

## 2021-04-29 ENCOUNTER — Ambulatory Visit (INDEPENDENT_AMBULATORY_CARE_PROVIDER_SITE_OTHER): Payer: Medicaid Other | Admitting: Obstetrics and Gynecology

## 2021-04-29 VITALS — BP 96/68 | HR 114 | Wt 205.8 lb

## 2021-04-29 DIAGNOSIS — Z3403 Encounter for supervision of normal first pregnancy, third trimester: Secondary | ICD-10-CM

## 2021-04-29 DIAGNOSIS — Z3A39 39 weeks gestation of pregnancy: Secondary | ICD-10-CM

## 2021-04-29 DIAGNOSIS — O479 False labor, unspecified: Secondary | ICD-10-CM | POA: Diagnosis not present

## 2021-04-29 DIAGNOSIS — O471 False labor at or after 37 completed weeks of gestation: Secondary | ICD-10-CM | POA: Insufficient documentation

## 2021-04-29 DIAGNOSIS — Z3490 Encounter for supervision of normal pregnancy, unspecified, unspecified trimester: Secondary | ICD-10-CM

## 2021-04-29 LAB — POCT URINALYSIS DIPSTICK OB
Bilirubin, UA: NEGATIVE
Blood, UA: NEGATIVE
Ketones, UA: NEGATIVE
Leukocytes, UA: NEGATIVE
Nitrite, UA: NEGATIVE
POC,PROTEIN,UA: NEGATIVE
Spec Grav, UA: 1.01 (ref 1.010–1.025)
Urobilinogen, UA: 0.2 E.U./dL
pH, UA: 7 (ref 5.0–8.0)

## 2021-04-29 NOTE — OB Triage Note (Signed)
Pt G1P0 with painful UCs since 1800 increasing in intesnisty. Pt denies problems during pregnancy. Saw Dr Logan Bores today in the office for well check up

## 2021-04-29 NOTE — Final Progress Note (Signed)
L&D OB Triage Note  Emily Bullock is a 25 y.o. G1P0 female at [redacted]w[redacted]d, EDD Estimated Date of Delivery: 05/05/21 who presented to triage for complaints of contractions since 6 pm this evening. She denied LOF, vaginal bleeding, and notes good FM.  She was evaluated by the nurses with no significant findings of active labor. Vital signs stable. An NST was performed and has been reviewed by MD.    Physical Exam:  Blood pressure 135/86, pulse (!) 109, temperature 98.2 F (36.8 C), temperature source Oral, resp. rate 18, height 5\' 2"  (1.575 m), weight 93.5 kg, last menstrual period 07/16/2020. Cervix: Dilation: 1 Effacement (%): 40 Station: -3 Presentation: Vertex Exam by:: JDaley    NST INTERPRETATION: Indications: rule out uterine contractions    Baseline Rate (A): 130 bpm Variability: Moderate Accelerations: 15 x 15 Decelerations: None     Contraction Frequency (min): 2-4  Impression: reactive   Plan: NST performed was reviewed and was found to be reactive. She was re-evaluated after 2 hours with no significant cervical change. She was discharged home with bleeding/labor precautions.  Continue routine prenatal care. Follow up with OB/GYN as previously scheduled.     [phone], MD

## 2021-04-29 NOTE — Progress Notes (Signed)
ROB: Patient with occasional contractions.  Reports daily fetal movement.  Signs and symptoms of labor reviewed.  Birth plan options discussed at patient request.  NST next visit.  Postdates induction discussed.

## 2021-04-30 ENCOUNTER — Encounter: Payer: Self-pay | Admitting: Obstetrics and Gynecology

## 2021-04-30 ENCOUNTER — Inpatient Hospital Stay: Payer: Medicaid Other | Admitting: Anesthesiology

## 2021-04-30 ENCOUNTER — Other Ambulatory Visit: Payer: Self-pay

## 2021-04-30 ENCOUNTER — Inpatient Hospital Stay
Admission: EM | Admit: 2021-04-30 | Discharge: 2021-05-01 | DRG: 807 | Disposition: A | Discharge status: Other Acute Inpatient Hospital | Payer: Medicaid Other | Attending: Obstetrics and Gynecology | Admitting: Obstetrics and Gynecology

## 2021-04-30 ENCOUNTER — Ambulatory Visit (INDEPENDENT_AMBULATORY_CARE_PROVIDER_SITE_OTHER): Payer: Medicaid Other | Admitting: Obstetrics and Gynecology

## 2021-04-30 VITALS — BP 131/88 | HR 108 | Wt 204.4 lb

## 2021-04-30 DIAGNOSIS — Z3490 Encounter for supervision of normal pregnancy, unspecified, unspecified trimester: Secondary | ICD-10-CM

## 2021-04-30 DIAGNOSIS — Z803 Family history of malignant neoplasm of breast: Secondary | ICD-10-CM

## 2021-04-30 DIAGNOSIS — O479 False labor, unspecified: Secondary | ICD-10-CM

## 2021-04-30 DIAGNOSIS — Z3403 Encounter for supervision of normal first pregnancy, third trimester: Secondary | ICD-10-CM

## 2021-04-30 DIAGNOSIS — Z8249 Family history of ischemic heart disease and other diseases of the circulatory system: Secondary | ICD-10-CM | POA: Diagnosis not present

## 2021-04-30 DIAGNOSIS — Z3A39 39 weeks gestation of pregnancy: Secondary | ICD-10-CM

## 2021-04-30 DIAGNOSIS — Z20822 Contact with and (suspected) exposure to covid-19: Secondary | ICD-10-CM | POA: Diagnosis not present

## 2021-04-30 DIAGNOSIS — Z9102 Food additives allergy status: Secondary | ICD-10-CM | POA: Diagnosis not present

## 2021-04-30 HISTORY — DX: Other specified health status: Z78.9

## 2021-04-30 LAB — CBC
HCT: 34 % — ABNORMAL LOW (ref 36.0–46.0)
Hemoglobin: 11.2 g/dL — ABNORMAL LOW (ref 12.0–15.0)
MCH: 29.9 pg (ref 26.0–34.0)
MCHC: 32.9 g/dL (ref 30.0–36.0)
MCV: 90.7 fL (ref 80.0–100.0)
Platelets: 327 10*3/uL (ref 150–400)
RBC: 3.75 MIL/uL — ABNORMAL LOW (ref 3.87–5.11)
RDW: 13.6 % (ref 11.5–15.5)
WBC: 17.4 10*3/uL — ABNORMAL HIGH (ref 4.0–10.5)
nRBC: 0 % (ref 0.0–0.2)

## 2021-04-30 LAB — TYPE AND SCREEN
ABO/RH(D): A POS
Antibody Screen: NEGATIVE

## 2021-04-30 LAB — ABO/RH: ABO/RH(D): A POS

## 2021-04-30 LAB — RESP PANEL BY RT-PCR (FLU A&B, COVID) ARPGX2
Influenza A by PCR: NEGATIVE
Influenza B by PCR: NEGATIVE
SARS Coronavirus 2 by RT PCR: NEGATIVE

## 2021-04-30 MED ORDER — OXYCODONE-ACETAMINOPHEN 5-325 MG PO TABS
2.0000 | ORAL_TABLET | ORAL | Status: DC | PRN
Start: 2021-04-30 — End: 2021-05-01

## 2021-04-30 MED ORDER — FENTANYL 2.5 MCG/ML W/ROPIVACAINE 0.15% IN NS 100 ML EPIDURAL (ARMC)
12.0000 mL/h | EPIDURAL | Status: DC
  Administered 2021-05-01: 12 mL/h via EPIDURAL
  Filled 2021-04-30: qty 100

## 2021-04-30 MED ORDER — LIDOCAINE-EPINEPHRINE (PF) 1.5 %-1:200000 IJ SOLN
INTRAMUSCULAR | Status: DC | PRN
Start: 2021-04-30 — End: 2021-05-01
  Administered 2021-04-30: 3 mL via EPIDURAL

## 2021-04-30 MED ORDER — DIPHENHYDRAMINE HCL 50 MG/ML IJ SOLN: 12.5000 mg | INTRAMUSCULAR | Status: DC | PRN

## 2021-04-30 MED ORDER — OXYTOCIN-SODIUM CHLORIDE 30-0.9 UT/500ML-% IV SOLN
1.0000 m[IU]/min | INTRAVENOUS | Status: DC
  Administered 2021-04-30: 2 m[IU]/min via INTRAVENOUS
  Administered 2021-05-01: 12 m[IU]/min via INTRAVENOUS
  Filled 2021-04-30: qty 500

## 2021-04-30 MED ORDER — LACTATED RINGERS IV SOLN: 500.0000 mL | INTRAVENOUS | Status: DC | PRN

## 2021-04-30 MED ORDER — LIDOCAINE HCL (PF) 1 % IJ SOLN
INTRAMUSCULAR | Status: DC | PRN
  Administered 2021-04-30: 1.2 mL

## 2021-04-30 MED ORDER — TERBUTALINE SULFATE 1 MG/ML IJ SOLN: 0.2500 mg | Freq: Once | INTRAMUSCULAR | Status: DC | PRN

## 2021-04-30 MED ORDER — FENTANYL 2.5 MCG/ML W/ROPIVACAINE 0.15% IN NS 100 ML EPIDURAL (ARMC)
EPIDURAL | Status: DC | PRN
  Administered 2021-04-30: 12 mL/h via EPIDURAL

## 2021-04-30 MED ORDER — BUTORPHANOL TARTRATE 1 MG/ML IJ SOLN
1.0000 mg | INTRAMUSCULAR | Status: DC | PRN
Start: 2021-04-30 — End: 2021-05-01
  Administered 2021-04-30 (×2): 1 mg via INTRAVENOUS
  Filled 2021-04-30 (×2): qty 1

## 2021-04-30 MED ORDER — EPHEDRINE 5 MG/ML INJ
10.0000 mg | INTRAVENOUS | Status: DC | PRN
  Filled 2021-04-30: qty 4

## 2021-04-30 MED ORDER — LACTATED RINGERS IV SOLN: INTRAVENOUS | Status: DC

## 2021-04-30 MED ORDER — PHENYLEPHRINE 40 MCG/ML (10ML) SYRINGE FOR IV PUSH (FOR BLOOD PRESSURE SUPPORT)
80.0000 ug | PREFILLED_SYRINGE | INTRAVENOUS | Status: DC | PRN
  Administered 2021-04-30: 80 ug via INTRAVENOUS
  Filled 2021-04-30: qty 10

## 2021-04-30 MED ORDER — LIDOCAINE HCL (PF) 1 % IJ SOLN: 30.0000 mL | INTRAMUSCULAR | Status: AC | PRN

## 2021-04-30 MED ORDER — OXYCODONE-ACETAMINOPHEN 5-325 MG PO TABS: 1.0000 | ORAL_TABLET | ORAL | Status: DC | PRN

## 2021-04-30 MED ORDER — EPHEDRINE 5 MG/ML INJ
10.0000 mg | INTRAVENOUS | Status: DC | PRN
  Administered 2021-04-30: 10 mg via INTRAVENOUS

## 2021-04-30 MED ORDER — SOD CITRATE-CITRIC ACID 500-334 MG/5ML PO SOLN
30.0000 mL | ORAL | Status: DC | PRN
  Administered 2021-05-01: 30 mL via ORAL
  Filled 2021-04-30: qty 15

## 2021-04-30 MED ORDER — MISOPROSTOL 200 MCG PO TABS
ORAL_TABLET | ORAL | Status: AC
  Filled 2021-04-30: qty 4

## 2021-04-30 MED ORDER — OXYTOCIN-SODIUM CHLORIDE 30-0.9 UT/500ML-% IV SOLN
2.5000 [IU]/h | INTRAVENOUS | Status: DC
  Administered 2021-05-01: 2.5 [IU]/h via INTRAVENOUS
  Filled 2021-04-30: qty 500

## 2021-04-30 MED ORDER — LACTATED RINGERS IV SOLN
500.0000 mL | Freq: Once | INTRAVENOUS | Status: AC
  Administered 2021-05-01: 500 mL via INTRAVENOUS

## 2021-04-30 MED ORDER — LIDOCAINE HCL (PF) 1 % IJ SOLN
INTRAMUSCULAR | Status: AC
  Administered 2021-05-01: 30 mL via SUBCUTANEOUS
  Filled 2021-04-30: qty 30

## 2021-04-30 MED ORDER — OXYTOCIN BOLUS FROM INFUSION
333.0000 mL | Freq: Once | INTRAVENOUS | Status: AC
  Administered 2021-05-01: 333 mL via INTRAVENOUS

## 2021-04-30 MED ORDER — ONDANSETRON HCL 4 MG/2ML IJ SOLN
4.0000 mg | Freq: Four times a day (QID) | INTRAMUSCULAR | Status: DC | PRN
  Administered 2021-05-01: 4 mg via INTRAVENOUS
  Filled 2021-04-30: qty 2

## 2021-04-30 MED ORDER — AMMONIA AROMATIC IN INHA
RESPIRATORY_TRACT | Status: AC
  Filled 2021-04-30: qty 10

## 2021-04-30 MED ORDER — FENTANYL 2.5 MCG/ML W/ROPIVACAINE 0.15% IN NS 100 ML EPIDURAL (ARMC)
EPIDURAL | Status: AC
  Filled 2021-04-30: qty 100

## 2021-04-30 MED ORDER — PHENYLEPHRINE 40 MCG/ML (10ML) SYRINGE FOR IV PUSH (FOR BLOOD PRESSURE SUPPORT): 80.0000 ug | PREFILLED_SYRINGE | INTRAVENOUS | Status: DC | PRN

## 2021-04-30 MED ORDER — OXYTOCIN 10 UNIT/ML IJ SOLN
INTRAMUSCULAR | Status: AC
  Filled 2021-04-30: qty 2

## 2021-04-30 MED ORDER — ACETAMINOPHEN 325 MG PO TABS: 650.0000 mg | ORAL_TABLET | ORAL | Status: DC | PRN

## 2021-04-30 NOTE — Anesthesia Preprocedure Evaluation (Signed)
Anesthesia Evaluation  Patient identified by MRN, date of birth, ID band Patient awake    Reviewed: Allergy & Precautions, NPO status , Patient's Chart, lab work & pertinent test results  History of Anesthesia Complications Negative for: history of anesthetic complications  Airway Mallampati: II       Dental   Pulmonary neg sleep apnea, neg COPD, Current Smoker,           Cardiovascular (-) hypertension(-) Past MI and (-) CHF (-) dysrhythmias (-) Valvular Problems/Murmurs     Neuro/Psych neg Seizures    GI/Hepatic Neg liver ROS, neg GERD  ,  Endo/Other  neg diabetes  Renal/GU negative Renal ROS     Musculoskeletal   Abdominal   Peds  Hematology   Anesthesia Other Findings   Reproductive/Obstetrics (+) Pregnancy                             Anesthesia Physical Anesthesia Plan  ASA: II  Anesthesia Plan: Epidural   Post-op Pain Management:    Induction:   PONV Risk Score and Plan:   Airway Management Planned:   Additional Equipment:   Intra-op Plan:   Post-operative Plan:   Informed Consent: I have reviewed the patients History and Physical, chart, labs and discussed the procedure including the risks, benefits and alternatives for the proposed anesthesia with the patient or authorized representative who has indicated his/her understanding and acceptance.       Plan Discussed with:   Anesthesia Plan Comments:         Anesthesia Quick Evaluation

## 2021-04-30 NOTE — Discharge Instructions (Signed)
First Stage of Labor Labor is your body's natural process of moving your baby and other structures, including the placenta and umbilical cord, out of your uterus. There are three stages of labor. How long each stage lasts is different for every woman. But certain events happen during each stage that are the same for everyone.  The first stage starts when true labor begins. This stage ends when your cervix, which is the opening from your uterus into your vagina, is completely open (dilated).  The second stage begins when your cervix is fully dilated and you start pushing. This stage ends when your baby is born.  The third stage is the delivery of the organ that nourished your baby during pregnancy (placenta). First stage of labor As your due date gets closer, you may start to notice certain physical changes that mean labor is going to start soon. You may feel that your baby has dropped lower into your pelvis. You may experience irregular, often painless, contractions that go away when you walk around or lie down (Braxton Hicks contractions). This is also called false labor. The first stage of labor begins when you start having contractions that come at regular (evenly spaced) intervals and your cervix starts to get thinner and wider in preparation for your baby to pass through. Birth care providers measure the dilation of your cervix in centimeters (cm). One centimeter is a little less than one-half of an inch. The first stage ends when your cervix is dilated to 10 cm. The first stage of labor is divided into three phases:  Early phase.  Active phase.  Transitional phase. The length of the first stage of labor varies. It may be longer if this is your first pregnancy. You may spend most of this stage at home trying to relax and stay comfortable. How does this affect me? During the first stage of labor, you will move through three phases. What happens in the early phase?  You will start to have  regular contractions that last 30-60 seconds. Contractions may come every 5-20 minutes. Keep track of your contractions and call your birth care provider.  Your water may break during this phase.  You may notice a clear or slightly bloody discharge of mucus (mucus plug) from your vagina.  Your cervix will dilate to 3-6 cm. What happens in the active phase? The active phase usually lasts 3-5 hours. You may go to the hospital or birth center around this time. During the active phase:  Your contractions will become stronger, longer, and more uncomfortable.  Your contractions may last 45-90 seconds and come every 3-5 minutes.  You may feel lower back pain.  Your birth care providers may examine your cervix and feel your belly to find the position of your baby.  You may have a monitor strapped to your belly to measure your contractions and your baby's heart rate.  You may start using your pain management options.  Your cervix may be dilated to 6 cm and may start to dilate more quickly. What happens in the transitional phase? The transitional phase typically lasts from 30 minutes to 2 hours. At the end of this phase, your cervix will be fully dilated to 10 cm. During the transitional phase:  Contractions will get stronger and longer.  Contractions may last 60-90 seconds and come less than 2 minutes apart.  You may feel hot flashes, chills, or nausea. How does this affect my baby? During the first stage of labor, your baby will   gradually move down into your birth canal. Follow these instructions at home and in the hospital or birth center:  When labor first begins, try to stay calm. You are still in the early phase. If it is night, try to get some sleep. If it is day, try to relax and save your energy. You may want to make some calls and get ready to go to the hospital or birth center.  When you are in the early phase, try these methods to help ease discomfort: ? Deep breathing and  muscle relaxation. ? Taking a walk. ? Taking a warm bath or shower.  Drink some fluids and have a light snack if you feel like it.  Keep track of your contractions.  Based on the plan you created with your birth care provider, call when your contractions indicate it is time.  If your water breaks, note the time, color, and odor of the fluid.  When you are in the active phase, do your breathing exercises and rely on your support people and your team of birth care providers.   Contact a health care provider if:  Your contractions are strong and regular.  You have lower back pain or cramping.  Your water breaks.  You lose your mucus plug. Get help right away if you:  Have a severe headache that does not go away.  Have changes in your vision.  Have severe pain in your upper belly.  Do not feel the baby move.  Have bright red bleeding. Summary  The first stage of labor starts when true labor begins, and it ends when your cervix is dilated to 10 cm.  The first stage of labor has three phases: early, active, and transitional.  Your baby moves into the birth canal during the first stage of labor.  You may have contractions that become stronger and longer. You may also lose your mucus plug and have your water break.  Call your birth care provider when your contractions are frequent and strong enough to go to the hospital or birth center. This information is not intended to replace advice given to you by your health care provider. Make sure you discuss any questions you have with your health care provider. Document Revised: 03/08/2019 Document Reviewed: 01/29/2018 Elsevier Patient Education  2021 [employer]  

## 2021-04-30 NOTE — H&P (View-Only) (Signed)
Obstetric History and Physical  Emily Bullock is a 25 y.o. G1P0 with IUP at [redacted]w[redacted]d presenting for complaints of worsening contractions. Of note, patient was seen in triage last night and again in the office earlier today with symptoms of early prodromal labor. Patient states she has been having  regular, every 2 minutes contractions, no vaginal bleeding, intact membranes, with active fetal movement.    Prenatal Course Source of Care: [facility] with onset of care at 9 weeks Pregnancy complications or risks: Patient Active Problem List   Diagnosis Date Noted  . Term pregnancy 04/30/2021  . Indication for care in labor or delivery 04/30/2021  . Irregular uterine contractions   . Labor and delivery indication for care or intervention 02/05/2021   She plans to breastfeed and formula feedingShe desires unsure method for postpartum contraception.   Prenatal labs and studies: ABO, Rh: A/Positive/-- (11/04 1025) Antibody: Negative (11/04 1025) Rubella: 4.74 (11/04 1025) RPR: Non Reactive (03/10 1037)  HBsAg: Negative (11/04 1025)  HIV: Non Reactive (11/04 1025)  XBM:WUXLKGMW/-- (05/12 0904) 1 hr Glucola  normal Genetic screening normal  (MaterniT21) Anatomy US normal    Past Medical History:  Diagnosis Date  . Medical history non-contributory     Past Surgical History:  Procedure Laterality Date  . TONSILLECTOMY    . WISDOM TOOTH EXTRACTION      OB History  Gravida Para Term Preterm AB Living  1            SAB IAB Ectopic Multiple Live Births               # Outcome Date GA Lbr Len/2nd Weight Sex Delivery Anes PTL Lv  1 Current             Social History   Socioeconomic History  . Marital status: Significant Other    Spouse name: Aneta Mins   . Number of children: Not on file  . Years of education: Not on file  . Highest education level: Not on file  Occupational History  . Not on file  Tobacco Use  . Smoking status: Never Smoker  . Smokeless tobacco:  Never Used  Vaping Use  . Vaping Use: Former  Substance and Sexual Activity  . Alcohol use: Not Currently  . Drug use: Not Currently    Types: Marijuana  . Sexual activity: Yes    Partners: Male    Birth control/protection: None  Other Topics Concern  . Not on file  Social History Narrative  . Not on file   Social Determinants of Health   Financial Resource Strain: Not on file  Food Insecurity: Not on file  Transportation Needs: Not on file  Physical Activity: Not on file  Stress: Not on file  Social Connections: Not on file    Family History  Problem Relation Age of Onset  . Hypertension Mother   . Breast cancer Mother     Medications Prior to Admission  Medication Sig Dispense Refill Last Dose  . Doxylamine-Pyridoxine (DICLEGIS) 10-10 MG TBEC Take 2 tablets by mouth at bedtime. If symptoms persist, add one tablet in the morning and one in the afternoon 100 tablet 5 Past Month at Unknown time  . Prenatal Vit-Fe Phos-FA-Omega (VITAFOL GUMMIES) 3.33-0.333-34.8 MG CHEW Take three gummies in the AM 90 tablet 11 04/30/2021 at Unknown time    Allergies  Allergen Reactions  . Red Dye Anaphylaxis    Review of Systems: Negative except for what is mentioned in HPI.  Physical Exam: BP (!) 149/90   Pulse (!) 118   Temp 97.9 F (36.6 C) (Oral)   Ht 5' 2" (1.575 m)   Wt 92.5 kg   LMP 07/16/2020   BMI 37.31 kg/m  CONSTITUTIONAL: Well-developed, well-nourished female in mild to moderate distress with contractions.  HENT:  Normocephalic, atraumatic, External right and left ear normal. Oropharynx is clear and moist EYES: Conjunctivae and EOM are normal. Pupils are equal, round, and reactive to light. No scleral icterus.  NECK: Normal range of motion, supple, no masses SKIN: Skin is warm and dry. No rash noted. Not diaphoretic. No erythema. No pallor. NEUROLOGIC: Alert and oriented to person, place, and time. Normal reflexes, muscle tone coordination. No cranial nerve deficit  noted. PSYCHIATRIC: Normal mood and affect. Normal behavior. Normal judgment and thought content. CARDIOVASCULAR: Normal heart rate noted, regular rhythm RESPIRATORY: Effort and breath sounds normal, no problems with respiration noted ABDOMEN: Soft, nontender, nondistended, gravid. MUSCULOSKELETAL: Normal range of motion. No edema and no tenderness. 2+ distal pulses.  Cervical Exam: Dilatation 4 cm   Effacement 80%   Station -2.  Narrow pubic arch Presentation: cephalic FHT:  Baseline rate 145 bpm   Variability moderate  Accelerations present   Decelerations none Contractions: Every 2-4 mins   Pertinent Labs/Studies:   Results for orders placed or performed during the hospital encounter of 04/30/21 (from the past 24 hour(s))  Resp Panel by RT-PCR (Flu A&B, Covid) Nasopharyngeal Swab     Status: None   Collection Time: 04/30/21  5:49 PM   Specimen: Nasopharyngeal Swab; Nasopharyngeal(NP) swabs in vial transport medium  Result Value Ref Range   SARS Coronavirus 2 by RT PCR NEGATIVE NEGATIVE   Influenza A by PCR NEGATIVE NEGATIVE   Influenza B by PCR NEGATIVE NEGATIVE  CBC     Status: Abnormal   Collection Time: 04/30/21  6:28 PM  Result Value Ref Range   WBC 17.4 (H) 4.0 - 10.5 K/uL   RBC 3.75 (L) 3.87 - 5.11 MIL/uL   Hemoglobin 11.2 (L) 12.0 - 15.0 g/dL   HCT 34.0 (L) 36.0 - 46.0 %   MCV 90.7 80.0 - 100.0 fL   MCH 29.9 26.0 - 34.0 pg   MCHC 32.9 30.0 - 36.0 g/dL   RDW 13.6 11.5 - 15.5 %   Platelets 327 150 - 400 K/uL   nRBC 0.0 0.0 - 0.2 %  Type and screen [facility]     Status: None (Preliminary result)   Collection Time: 04/30/21  6:28 PM  Result Value Ref Range   ABO/RH(D) PENDING    Antibody Screen PENDING    Sample Expiration      05/03/2021,2359 Performed at [facility], [address] [place], [place] [address]     Assessment : Emily Bullock is a 24 y.o. G1P0 at [redacted]w[redacted]d being admitted for labor.  Plan: Labor: Expectant  management.  Augmentation as ordered as per protocol. Analgesia as needed. FWB: Reassuring fetal heart tracing.  GBS negative Delivery plan: Hopeful for vaginal delivery   Adger Cantera, MD [facility]     Addendum: 06/19/2021 at 7:22 PM  Patient AROM'd with moderate meconium present.  Cervical exam unchanged. Nursery notified.   

## 2021-04-30 NOTE — Progress Notes (Signed)
ROB: Patient seen yesterday in the office, last night in labor and delivery for contractions, and call the office today stating that her contractions got worse and she is having some spotting.  Cervical exam unchanged.  Likely in latent labor.  Discussed with patient.  Signs and symptoms of labor again reviewed.  Discussed management with Dr. Valentino Saxon.

## 2021-04-30 NOTE — Progress Notes (Signed)
Discussed early labor. Pt and sign other understand and feel good going home. Many questions answered about when to return. Reviewed many scenarios with pt and they have a good grasp on when to come back or when to call the office.

## 2021-04-30 NOTE — OB Triage Note (Signed)
Pt presented to L/D triage with reported contractions that began to intensify yesterday at 1800. Pt rates pain as intermittent, rated 10/10. Pt reports light spotting with no LOF. Pt reports positive fetal movement. Monitors applied and assessing. VSS.

## 2021-04-30 NOTE — H&P (Addendum)
Obstetric History and Physical  Ajanee Steinmeyer is a 25 y.o. G1P0 with IUP at [redacted]w[redacted]d presenting for complaints of worsening contractions. Of note, patient was seen in triage last night and again in the office earlier today with symptoms of early prodromal labor. Patient states she has been having  regular, every 2 minutes contractions, no vaginal bleeding, intact membranes, with active fetal movement.    Prenatal Course Source of Care: [facility] with onset of care at 9 weeks Pregnancy complications or risks: Patient Active Problem List   Diagnosis Date Noted  . Term pregnancy 04/30/2021  . Indication for care in labor or delivery 04/30/2021  . Irregular uterine contractions   . Labor and delivery indication for care or intervention 02/05/2021   She plans to breastfeed and formula feedingShe desires unsure method for postpartum contraception.   Prenatal labs and studies: ABO, Rh: A/Positive/-- (11/04 1025) Antibody: Negative (11/04 1025) Rubella: 4.74 (11/04 1025) RPR: Non Reactive (03/10 1037)  HBsAg: Negative (11/04 1025)  HIV: Non Reactive (11/04 1025)  XBM:WUXLKGMW/-- (05/12 0904) 1 hr Glucola  normal Genetic screening normal  (MaterniT21) Anatomy US normal    Past Medical History:  Diagnosis Date  . Medical history non-contributory     Past Surgical History:  Procedure Laterality Date  . TONSILLECTOMY    . WISDOM TOOTH EXTRACTION      OB History  Gravida Para Term Preterm AB Living  1            SAB IAB Ectopic Multiple Live Births               # Outcome Date GA Lbr Len/2nd Weight Sex Delivery Anes PTL Lv  1 Current             Social History   Socioeconomic History  . Marital status: Significant Other    Spouse name: Aneta Mins   . Number of children: Not on file  . Years of education: Not on file  . Highest education level: Not on file  Occupational History  . Not on file  Tobacco Use  . Smoking status: Never Smoker  . Smokeless tobacco:  Never Used  Vaping Use  . Vaping Use: Former  Substance and Sexual Activity  . Alcohol use: Not Currently  . Drug use: Not Currently    Types: Marijuana  . Sexual activity: Yes    Partners: Male    Birth control/protection: None  Other Topics Concern  . Not on file  Social History Narrative  . Not on file   Social Determinants of Health   Financial Resource Strain: Not on file  Food Insecurity: Not on file  Transportation Needs: Not on file  Physical Activity: Not on file  Stress: Not on file  Social Connections: Not on file    Family History  Problem Relation Age of Onset  . Hypertension Mother   . Breast cancer Mother     Medications Prior to Admission  Medication Sig Dispense Refill Last Dose  . Doxylamine-Pyridoxine (DICLEGIS) 10-10 MG TBEC Take 2 tablets by mouth at bedtime. If symptoms persist, add one tablet in the morning and one in the afternoon 100 tablet 5 Past Month at Unknown time  . Prenatal Vit-Fe Phos-FA-Omega (VITAFOL GUMMIES) 3.33-0.333-34.8 MG CHEW Take three gummies in the AM 90 tablet 11 04/30/2021 at Unknown time    Allergies  Allergen Reactions  . Red Dye Anaphylaxis    Review of Systems: Negative except for what is mentioned in HPI.  Physical Exam: BP (!) 149/90   Pulse (!) 118   Temp 97.9 F (36.6 C) (Oral)   Ht 5\' 2"  (1.575 m)   Wt 92.5 kg   LMP 07/16/2020   BMI 37.31 kg/m  CONSTITUTIONAL: Well-developed, well-nourished female in mild to moderate distress with contractions.  HENT:  Normocephalic, atraumatic, External right and left ear normal. Oropharynx is clear and moist EYES: Conjunctivae and EOM are normal. Pupils are equal, round, and reactive to light. No scleral icterus.  NECK: Normal range of motion, supple, no masses SKIN: Skin is warm and dry. No rash noted. Not diaphoretic. No erythema. No pallor. NEUROLOGIC: Alert and oriented to person, place, and time. Normal reflexes, muscle tone coordination. No cranial nerve deficit  noted. PSYCHIATRIC: Normal mood and affect. Normal behavior. Normal judgment and thought content. CARDIOVASCULAR: Normal heart rate noted, regular rhythm RESPIRATORY: Effort and breath sounds normal, no problems with respiration noted ABDOMEN: Soft, nontender, nondistended, gravid. MUSCULOSKELETAL: Normal range of motion. No edema and no tenderness. 2+ distal pulses.  Cervical Exam: Dilatation 4 cm   Effacement 80%   Station -2.  Narrow pubic arch Presentation: cephalic FHT:  Baseline rate 145 bpm   Variability moderate  Accelerations present   Decelerations none Contractions: Every 2-4 mins   Pertinent Labs/Studies:   Results for orders placed or performed during the hospital encounter of 04/30/21 (from the past 24 hour(s))  Resp Panel by RT-PCR (Flu A&B, Covid) Nasopharyngeal Swab     Status: None   Collection Time: 04/30/21  5:49 PM   Specimen: Nasopharyngeal Swab; Nasopharyngeal(NP) swabs in vial transport medium  Result Value Ref Range   SARS Coronavirus 2 by RT PCR NEGATIVE NEGATIVE   Influenza A by PCR NEGATIVE NEGATIVE   Influenza B by PCR NEGATIVE NEGATIVE  CBC     Status: Abnormal   Collection Time: 04/30/21  6:28 PM  Result Value Ref Range   WBC 17.4 (H) 4.0 - 10.5 K/uL   RBC 3.75 (L) 3.87 - 5.11 MIL/uL   Hemoglobin 11.2 (L) 12.0 - 15.0 g/dL   HCT 06/30/21 (L) 02.7 - 25.3 %   MCV 90.7 80.0 - 100.0 fL   MCH 29.9 26.0 - 34.0 pg   MCHC 32.9 30.0 - 36.0 g/dL   RDW 66.4 40.3 - 47.4 %   Platelets 327 150 - 400 K/uL   nRBC 0.0 0.0 - 0.2 %  Type and screen [facility]     Status: None (Preliminary result)   Collection Time: 04/30/21  6:28 PM  Result Value Ref Range   ABO/RH(D) PENDING    Antibody Screen PENDING    Sample Expiration      05/03/2021,2359 Performed at [facility], [address], [place], [place]     Assessment : Kaysen Sefcik is a 25 y.o. G1P0 at [redacted]w[redacted]d being admitted for labor.  Plan: Labor: Expectant  management.  Augmentation as ordered as per protocol. Analgesia as needed. FWB: Reassuring fetal heart tracing.  GBS negative Delivery plan: Hopeful for vaginal delivery   [redacted]w[redacted]d, MD [facility]     Addendum: 06/19/2021 at 7:22 PM  Patient AROM'd with moderate meconium present.  Cervical exam unchanged. Nursery notified.

## 2021-04-30 NOTE — Anesthesia Procedure Notes (Signed)
Epidural Patient location during procedure: OB Start time: 04/30/2021 10:30 PM End time: 04/30/2021 10:59 PM  Staffing Performed: anesthesiologist   Preanesthetic Checklist Completed: patient identified, IV checked, site marked, risks and benefits discussed, surgical consent, monitors and equipment checked, pre-op evaluation and timeout performed  Epidural Patient position: sitting Prep: ChloraPrep Patient monitoring: heart rate, continuous pulse ox and blood pressure Approach: midline Location: L3-L4 Injection technique: LOR saline  Needle:  Needle type: Tuohy  Needle gauge: 18 G Needle length: 9 cm and 9 Needle insertion depth: 9 cm Catheter type: closed end flexible Catheter size: 20 Guage Catheter at skin depth: 15 cm Test dose: negative and 1.5% lidocaine with Epi 1:200 K  Assessment Events: blood not aspirated, injection not painful, no injection resistance, no paresthesia and negative IV test  Additional Notes   Patient tolerated the insertion well without complications.Reason for block:procedure for pain

## 2021-05-01 ENCOUNTER — Encounter: Payer: Self-pay | Admitting: Obstetrics and Gynecology

## 2021-05-01 ENCOUNTER — Inpatient Hospital Stay (HOSPITAL_COMMUNITY)
Admission: AD | Admit: 2021-05-01 | Discharge: 2021-05-03 | DRG: 807 | Disposition: A | Discharge status: Home / Self Care | Payer: Medicaid Other | Attending: Obstetrics and Gynecology | Admitting: Obstetrics and Gynecology

## 2021-05-01 ENCOUNTER — Encounter (HOSPITAL_COMMUNITY): Payer: Self-pay | Admitting: Family Medicine

## 2021-05-01 DIAGNOSIS — Z20822 Contact with and (suspected) exposure to covid-19: Secondary | ICD-10-CM | POA: Diagnosis present

## 2021-05-01 DIAGNOSIS — O165 Unspecified maternal hypertension, complicating the puerperium: Secondary | ICD-10-CM | POA: Diagnosis not present

## 2021-05-01 DIAGNOSIS — Z3A39 39 weeks gestation of pregnancy: Secondary | ICD-10-CM

## 2021-05-01 DIAGNOSIS — Z3490 Encounter for supervision of normal pregnancy, unspecified, unspecified trimester: Secondary | ICD-10-CM

## 2021-05-01 DIAGNOSIS — O26893 Other specified pregnancy related conditions, third trimester: Secondary | ICD-10-CM | POA: Diagnosis not present

## 2021-05-01 LAB — CBC
HCT: 30.3 % — ABNORMAL LOW (ref 36.0–46.0)
Hemoglobin: 9.7 g/dL — ABNORMAL LOW (ref 12.0–15.0)
MCH: 29.7 pg (ref 26.0–34.0)
MCHC: 32 g/dL (ref 30.0–36.0)
MCV: 92.7 fL (ref 80.0–100.0)
Platelets: 266 10*3/uL (ref 150–400)
RBC: 3.27 MIL/uL — ABNORMAL LOW (ref 3.87–5.11)
RDW: 13.7 % (ref 11.5–15.5)
WBC: 22.4 10*3/uL — ABNORMAL HIGH (ref 4.0–10.5)
nRBC: 0 % (ref 0.0–0.2)

## 2021-05-01 LAB — COMPREHENSIVE METABOLIC PANEL
ALT: 10 U/L (ref 0–44)
AST: 24 U/L (ref 15–41)
Albumin: 2.3 g/dL — ABNORMAL LOW (ref 3.5–5.0)
Alkaline Phosphatase: 129 U/L — ABNORMAL HIGH (ref 38–126)
Anion gap: 8 (ref 5–15)
BUN: 6 mg/dL (ref 6–20)
CO2: 21 mmol/L — ABNORMAL LOW (ref 22–32)
Calcium: 8.6 mg/dL — ABNORMAL LOW (ref 8.9–10.3)
Chloride: 101 mmol/L (ref 98–111)
Creatinine, Ser: 0.86 mg/dL (ref 0.44–1.00)
GFR, Estimated: >60 mL/min (ref >60)
Glucose, Bld: 100 mg/dL — ABNORMAL HIGH (ref 70–99)
Potassium: 3.8 mmol/L (ref 3.5–5.1)
Sodium: 130 mmol/L — ABNORMAL LOW (ref 135–145)
Total Bilirubin: 0.6 mg/dL (ref 0.3–1.2)
Total Protein: 5.4 g/dL — ABNORMAL LOW (ref 6.5–8.1)

## 2021-05-01 LAB — RPR: RPR Ser Ql: NONREACTIVE

## 2021-05-01 LAB — TYPE AND SCREEN
ABO/RH(D): A POS
Antibody Screen: NEGATIVE

## 2021-05-01 MED ORDER — BENZOCAINE-MENTHOL 20-0.5 % EX AERO
1.0000 "application " | INHALATION_SPRAY | CUTANEOUS | Status: DC | PRN
  Administered 2021-05-02: 1 via TOPICAL
  Filled 2021-05-01: qty 56

## 2021-05-01 MED ORDER — IBUPROFEN 600 MG PO TABS
600.0000 mg | ORAL_TABLET | Freq: Four times a day (QID) | ORAL | Status: DC
  Administered 2021-05-01 – 2021-05-03 (×8): 600 mg via ORAL
  Filled 2021-05-01 (×8): qty 1

## 2021-05-01 MED ORDER — SIMETHICONE 80 MG PO CHEW
80.0000 mg | CHEWABLE_TABLET | ORAL | Status: DC | PRN
  Filled 2021-05-01: qty 1

## 2021-05-01 MED ORDER — ZOLPIDEM TARTRATE 5 MG PO TABS: 5.0000 mg | ORAL_TABLET | Freq: Every evening | ORAL | Status: DC | PRN

## 2021-05-01 MED ORDER — IBUPROFEN 600 MG PO TABS
600.0000 mg | ORAL_TABLET | Freq: Four times a day (QID) | ORAL | Status: DC
  Administered 2021-05-01: 600 mg via ORAL

## 2021-05-01 MED ORDER — NIFEDIPINE ER OSMOTIC RELEASE 30 MG PO TB24
30.0000 mg | ORAL_TABLET | Freq: Every day | ORAL | Status: DC
  Administered 2021-05-01 – 2021-05-03 (×3): 30 mg via ORAL
  Filled 2021-05-01 (×3): qty 1

## 2021-05-01 MED ORDER — LACTATED RINGERS AMNIOINFUSION: Freq: Once | INTRAVENOUS | Status: AC

## 2021-05-01 MED ORDER — OXYTOCIN-SODIUM CHLORIDE 30-0.9 UT/500ML-% IV SOLN: 2.5000 [IU]/h | INTRAVENOUS | Status: DC | PRN

## 2021-05-01 MED ORDER — LACTATED RINGERS AMNIOINFUSION
Freq: Once | INTRAVENOUS | Status: DC
  Filled 2021-05-01: qty 1000

## 2021-05-01 MED ORDER — ACETAMINOPHEN 325 MG PO TABS: 650.0000 mg | ORAL_TABLET | Freq: Four times a day (QID) | ORAL | Status: DC

## 2021-05-01 MED ORDER — DIPHENHYDRAMINE HCL 25 MG PO CAPS: 25.0000 mg | ORAL_CAPSULE | Freq: Four times a day (QID) | ORAL | Status: DC | PRN

## 2021-05-01 MED ORDER — ACETAMINOPHEN 500 MG PO TABS
1000.0000 mg | ORAL_TABLET | Freq: Four times a day (QID) | ORAL | Status: DC | PRN
  Administered 2021-05-01: 1000 mg via ORAL
  Filled 2021-05-01: qty 2

## 2021-05-01 MED ORDER — TETANUS-DIPHTH-ACELL PERTUSSIS 5-2.5-18.5 LF-MCG/0.5 IM SUSY: 0.5000 mL | PREFILLED_SYRINGE | Freq: Once | INTRAMUSCULAR | Status: DC

## 2021-05-01 MED ORDER — ACETAMINOPHEN 325 MG PO TABS: 650.0000 mg | ORAL_TABLET | ORAL | Status: DC | PRN

## 2021-05-01 MED ORDER — DOCUSATE SODIUM 100 MG PO CAPS: 100.0000 mg | ORAL_CAPSULE | Freq: Two times a day (BID) | ORAL | Status: DC

## 2021-05-01 MED ORDER — IBUPROFEN 600 MG PO TABS
ORAL_TABLET | ORAL | Status: AC
  Filled 2021-05-01: qty 1

## 2021-05-01 MED ORDER — PRENATAL MULTIVITAMIN CH: 1.0000 | ORAL_TABLET | Freq: Every day | ORAL | Status: DC

## 2021-05-01 MED ORDER — OXYCODONE-ACETAMINOPHEN 5-325 MG PO TABS: 1.0000 | ORAL_TABLET | ORAL | Status: DC | PRN

## 2021-05-01 NOTE — Discharge Summary (Signed)
      Patient Name: Emily Bullock DOB: 09-17-1996 MRN: [id number]                            Discharge Summary  Date of Admission: 05/01/2021 Date of Discharge: 05/01/2021 Delivering Provider: Linzie Collin   Admitting Diagnosis: normal labor Secondary diagnosis:  Active Problems:   * No active hospital problems. *   Mode of Delivery: normal spontaneous vaginal delivery              Discharge diagnosis: Term Pregnancy Delivered      Intrapartum Procedures: Atificial rupture of membranes, epidural, pitocin augmentation and placement of intrauterine catheter    Complications: second degree perineal laceration                     Discharge Day SOAP Note:  Progress Note - Vaginal Delivery  Emily Bullock is a 25 y.o. G1P1001 now PP day 0 s/p Vaginal, Spontaneous . Delivery was uncomplicated  Baby required resuscitation.  Subjective  The patient has the following complaints: has no unusual complaints.  Baby transferred to [facility] and pt desires discharge and readmit to [facility] pot-partum.    Pain is controlled with current medications.   Patient is urinating without difficulty.  She is ambulating well.     Objective  Vital signs: BP (!) 140/93 (BP Location: Left Arm)   Pulse 92   Temp 98.5 F (36.9 C) (Oral)   Resp 18   LMP 07/16/2020   SpO2 98%   Physical Exam: Gen: NAD Fundus Fundal Tone: Firm  Lochia Amount: Small        Data Review Labs: Lab Results  Component Value Date   WBC 22.4 (H) 05/01/2021   HGB 9.7 (L) 05/01/2021   HCT 30.3 (L) 05/01/2021   MCV 92.7 05/01/2021   PLT 266 05/01/2021   CBC Latest Ref Rng & Units 05/01/2021 04/30/2021 02/05/2021  WBC 4.0 - 10.5 K/uL 22.4(H) 17.4(H) 10.0  Hemoglobin 12.0 - 15.0 g/dL 3.5(H) 11.2(L) 10.7(L)  Hematocrit 36.0 - 46.0 % 30.3(L) 34.0(L) 32.4(L)  Platelets 150 - 400 K/uL 266 327 279   A POS  Edinburgh Score: No flowsheet data found.  Assessment/Plan  Active Problems:   * No active hospital  problems. *    Discharge Instructions: Per After Visit Summary. Activity: Advance as tolerated. Pelvic rest for 6 weeks.  Also refer to After Visit Summary Diet: Regular Medications: Allergies as of 05/01/2021      Reactions   Red Dye Anaphylaxis      Medication List    STOP taking these medications   Doxylamine-Pyridoxine 10-10 MG Tbec Commonly known as: Diclegis     TAKE these medications   Vitafol Gummies 3.33-0.333-34.8 MG Chew Take three gummies in the AM      Outpatient follow up:  Postpartum contraception: Will discuss at first office visit post-partum  Discharged Condition: good.  Stable immediately post-partum.  [facility]  Newborn Data: Disposition:NICU  Apgars: APGAR (1 MIN): 0   APGAR (5 MINS): 2   APGAR (10 MINS): 3      Elonda Husky, M.D. 05/01/2021 9:01 PM

## 2021-05-01 NOTE — Progress Notes (Signed)
Intrapartum Progress Note  S: Patient complains of headache   O: Blood pressure 130/79, pulse (!) 113, temperature 98.2 F (36.8 C), temperature source Oral, resp. rate 16, height 5\' 2"  (1.575 m), weight 92.5 kg, last menstrual period 07/16/2020, SpO2 99 %. Gen App: NAD, comfortable Abdomen: soft, gravid FHT: baseline 150 bpm.  Accels present.  Decels present - intermittent variable deceleration. moderate in degree variability.   Tocometer: contractions irregular, q 2-5 minutes Cervix: 5/80/-2 Extremities: Nontender, no edema.  Pitocin: 8 mIU  Labs: No new labs   Assessment:  1: SIUP at [redacted]w[redacted]d 2. Category II tracing 3. Moderate meconium  Plan:  1. Amnioinfusion running 2.  AROM'd at 7:15 pm. Patient remains afebrile. Continue to monitor for s/s of chorioamnionitis. 3.  Patient informed that if tracing worsens that recommendations would likely be to proceed with C-section for delivery as we would not be able to continue with augmentation of labor due to fetal intolerance.  Sign out given to Dr. [redacted]w[redacted]d for transition of labor management.   Brennan Bailey, MD 05/01/2021 7:28 AM

## 2021-05-01 NOTE — Progress Notes (Signed)
Report given to [facility], Shannon Bullins. Provider filled out Medical Necessity form and Part 1 and 2 of EMTALA form. Provider at bedside to re-assess at 1536 and documented at 1536, [facility] arrived at 2. Vitals signs taken at 1611. VSS. Difficulty printing EMTALA paperwork. Patient gave verbal consent to RN and [facility] for transport. Patient left the unit in stable condition with transport.

## 2021-05-01 NOTE — Progress Notes (Addendum)
Spoke with Dr. Logan Bores, per verbal order, restart pit at 59mu now.

## 2021-05-01 NOTE — Lactation Note (Signed)
This note was copied from a baby's chart. Lactation Consultation Note Baby is 6 hrs old in NICU. Baby NPO on cooling blanket. Mom has DEBP set up in room.  LC reviewed how to use pump. Flanges to large. LC will have to bring mom #21 flanges and mom wants LC there when she pumps when she returns from NICU. Mom knows to pump q3h for 15-20 min.  LC demonstrated hand expression. Mom tender. Mom returned demonstration. No colostrum noted at this time. Mom has Large pendulous breast w/ small semi flat nipples at the bottom of breast. Encouraged mom to wear bra for support in am. Noted Lt. Breast has some areola edema. Shells given and encouraged to wear in am.  NICU and Lactation brochure given. Encouraged mom to call for questions or concerns.    Patient Name: Emily Bullock [id number] Date: 05/01/2021 Reason for consult: Initial assessment;Primapara;Term;NICU baby Age:27 hours  Maternal Data Has patient been taught Hand Expression?: Yes Does the patient have breastfeeding experience prior to this delivery?: No  Feeding    LATCH Score       Type of Nipple: Flat  Comfort (Breast/Nipple): Soft / non-tender         Lactation Tools Discussed/Used Tools: Pump;Flanges;Shells Flange Size: 21 Breast pump type: Double-Electric Breast Pump Pump Education: Setup, frequency, and cleaning;Milk Storage Reason for Pumping: NICU baby Pumping frequency: Q 3 hrs  Interventions Interventions: Pre-pump if needed;Hand express;DEBP;Breast compression  Discharge WIC Program: Yes  Consult Status Consult Status: Follow-up Date: 05/02/21 Follow-up type: In-patient    Charyl Dancer 05/01/2021, 9:44 PM

## 2021-05-01 NOTE — Progress Notes (Signed)
Intrapartum Progress Note  S: Patient with no complaints.   O: Blood pressure 124/80, pulse (!) 108, temperature 98.2 F (36.8 C), temperature source Oral, resp. rate 14, height 5\' 2"  (1.575 m), weight 92.5 kg, last menstrual period 07/16/2020, SpO2 98 %. Gen App: NAD, comfortable Abdomen: soft, gravid FHT: baseline 150 bpm.  Accels present.  Decels present - recurrent variable decelerations followed by prolonged deceleration from baseline 150s to 60s with return to 120s. . moderate in degree variability.   Tocometer: contractions irregular, q  minutes Cervix: 4/80/-2 Extremities: Nontender, no edema.  Pitocin: 4 mIU  Labs: No new labs   Assessment:  1: SIUP at [redacted]w[redacted]d 2. Category II tracing 3. Moderate meconium  Plan:  1. Patient position changed, tracing improved in hands/knees position. Pitocin discontinued. Will monitor for 30 minutes and then resume Pitocin.  2. Will start amnioinfusion    [redacted]w[redacted]d, MD 05/01/2021 2:23 AM

## 2021-05-01 NOTE — Progress Notes (Signed)
Intrapartum Progress Note  S: Patient feeling more comfortable, s/p epidural.   O: Blood pressure 124/80, pulse (!) 108, temperature 98.1 F (36.7 C), temperature source Oral, resp. rate 14, height 5\' 2"  (1.575 m), weight 92.5 kg, last menstrual period 07/16/2020, SpO2 98 %. Gen App: NAD, comfortable Abdomen: soft, gravid FHT: baseline 150 bpm.  Accels present.  Decels present - variable decelerations, noted during 2300 hour, after epidural placement.  Have now resolved. moderate in degree variability.   Tocometer: contractions q 1-5 minutes  Cervix: 4/80/-2. AROM'd at last 7 pm with meconium fluid.  Extremities: Nontender, no edema.  Pitocin: 6 mIU  Labs:  Lab Results  Component Value Date   [facility]  04/30/2021    A POS Performed at [facility], [address] [place], [place]       Assessment:  1: SIUP at [redacted]w[redacted]d 2. Meconium fluid 3. Previous category II tracing, now resolved.  Plan:  1. Will place IUPC for amnioinfusion if variables rturn.  2. Continue augmentation with Pitocin   [redacted]w[redacted]d, MD 05/01/2021 12:14 AM

## 2021-05-01 NOTE — Discharge Summary (Deleted)
  The note originally documented on this encounter has been moved the the encounter in which it belongs.  

## 2021-05-02 DIAGNOSIS — Z3490 Encounter for supervision of normal pregnancy, unspecified, unspecified trimester: Secondary | ICD-10-CM

## 2021-05-02 LAB — PROTEIN / CREATININE RATIO, URINE
Creatinine, Urine: 82.6 mg/dL
Protein Creatinine Ratio: 1.17 mg/mg{Cre} — ABNORMAL HIGH (ref 0.00–0.15)
Total Protein, Urine: 97 mg/dL

## 2021-05-02 NOTE — Progress Notes (Signed)
POSTPARTUM PROGRESS NOTE  PPD #1  Subjective:  Tacia Hindley is a 25 y.o. G1P1001 s/p NSVD at [redacted]w[redacted]d. Today she notes no acute complaints. She denies any problems with ambulating, voiding or po intake. Denies nausea or vomiting. She has + flatus, noBM.  Pain is well controlled.  Lochia moderate Denies fever/chills/chest pain/SOB.  no HA, no blurry vision, noRUQ pain  Objective: Blood pressure 116/62, pulse 91, temperature 98.2 F (36.8 C), temperature source Oral, resp. rate 18, last menstrual period 07/16/2020, SpO2 100 %, unknown if currently breastfeeding.  Physical Exam:  General: alert, cooperative and no distress Chest: no respiratory distress Heart: regular rate and rhythm Abdomen: soft, nontender Uterine Fundus: firm, appropriately tender DVT Evaluation: No calf swelling or tenderness Extremities: 1+ pedal edema Skin: warm, dry  Results for orders placed or performed during the hospital encounter of 05/01/21 (from the past 24 hour(s))  CBC     Status: Abnormal   Collection Time: 05/01/21  6:45 PM  Result Value Ref Range   WBC 22.4 (H) 4.0 - 10.5 K/uL   RBC 3.27 (L) 3.87 - 5.11 MIL/uL   Hemoglobin 9.7 (L) 12.0 - 15.0 g/dL   HCT 29.5 (L) 62.1 - 30.8 %   MCV 92.7 80.0 - 100.0 fL   MCH 29.7 26.0 - 34.0 pg   MCHC 32.0 30.0 - 36.0 g/dL   RDW 65.7 84.6 - 96.2 %   Platelets 266 150 - 400 K/uL   nRBC 0.0 0.0 - 0.2 %  Comprehensive metabolic panel     Status: Abnormal   Collection Time: 05/01/21  6:45 PM  Result Value Ref Range   Sodium 130 (L) 135 - 145 mmol/L   Potassium 3.8 3.5 - 5.1 mmol/L   Chloride 101 98 - 111 mmol/L   CO2 21 (L) 22 - 32 mmol/L   Glucose, Bld 100 (H) 70 - 99 mg/dL   BUN 6 6 - 20 mg/dL   Creatinine, Ser 9.52 0.44 - 1.00 mg/dL   Calcium 8.6 (L) 8.9 - 10.3 mg/dL   Total Protein 5.4 (L) 6.5 - 8.1 g/dL   Albumin 2.3 (L) 3.5 - 5.0 g/dL   AST 24 15 - 41 U/L   ALT 10 0 - 44 U/L   Alkaline Phosphatase 129 (H) 38 - 126 U/L   Total Bilirubin 0.6 0.3 -  1.2 mg/dL   GFR, Estimated >84 >13 mL/min   Anion gap 8 5 - 15  Type and screen [facility]     Status: None   Collection Time: 05/01/21  6:45 PM  Result Value Ref Range   ABO/RH(D) A POS    Antibody Screen NEG    Sample Expiration      05/04/2021,2359 Performed at [facility], [address] [place], [place] [address]   Protein / creatinine ratio, urine     Status: Abnormal   Collection Time: 05/02/21  5:22 AM  Result Value Ref Range   Creatinine, Urine 82.60 mg/dL   Total Protein, Urine 97 mg/dL   Protein Creatinine Ratio 1.17 (H) 0.00 - 0.15 mg/mg[Cre]    Assessment/Plan: Annaliah Rivenbark is a 25 y.o. G1P1001 s/p NSVD at [redacted]w[redacted]d PPD#1 complicated by: 1) Preeclampsia no severe features- started on Procardia XL 30mg  daily, BP now within normal limited, pt asymptomatic 2) routine postpartum care Baby in NICU  Dispo: Continue routine postpartum care, plan for discharge home tomorrow   LOS: 1 day   , DO Faculty Attending, [facility] 05/02/2021, 7:28 AM

## 2021-05-02 NOTE — Progress Notes (Signed)
Initial visit for spiritual/emotional support for pt Emily Bullock. Her first and only child is currently in the NICU, and per RN, is "critical". Chaplain attempted visit, but Ms. Tynesha was not in her room nor in the NICU at the time of visit. Chaplain remains available for follow-up spiritual/emotional support as needed.  Ardath Sax, [place]     05/02/21 1900  Clinical Encounter Type  Visited With Patient not available  Visit Type Other (Comment) (Pt's baby in NICU)  Referral From Nurse

## 2021-05-02 NOTE — Lactation Note (Signed)
This note was copied from a baby's chart. Lactation Consultation Note LC checked on mom who was sitting at bedside of baby in NICU. Mom hadn't pumped. She has been crying a lot today upset over baby's status. Mom stated she is going to be d/c tomorrow and will be staying at bedside in NICU and will start pumping then. Encouraged mom to call if she needs LC or has any questions.  Patient Name: Emily Bullock Date: 05/02/2021   Age:27 hours  Maternal Data    Feeding    LATCH Score                    Lactation Tools Discussed/Used    Interventions    Discharge    Consult Status      Charyl Dancer 05/02/2021, 9:56 PM

## 2021-05-03 DIAGNOSIS — O165 Unspecified maternal hypertension, complicating the puerperium: Secondary | ICD-10-CM

## 2021-05-03 MED ORDER — NIFEDIPINE ER 30 MG PO TB24: 30.0000 mg | ORAL_TABLET | Freq: Every day | ORAL | 0 refills | Status: DC

## 2021-05-03 MED ORDER — IBUPROFEN 600 MG PO TABS: 600.0000 mg | ORAL_TABLET | Freq: Four times a day (QID) | ORAL | 0 refills | Status: DC | PRN

## 2021-05-03 NOTE — Discharge Summary (Signed)
Postpartum Discharge Summary    Patient Name: Emily Bullock DOB: [birth year] MRN: [id number]  Date of admission: 05/01/2021 Delivery date:05/01/2021  Delivering provider: Linzie Collin  [facility] [place]) Date of discharge: 05/03/2021  Admitting diagnosis: PPD#0 s/p vaginal delivery at 39wks. Baby needing [facility]ICU [facility] at [facility]   Discharge diagnosis: Term Pregnancy Delivered. Postpartum hypertension                                              Post partum procedures:None Complications: None  Hospital course: Patient had an uncomplicated postpartum course except for mild range BPs. She had negative pre-eclampsia labs and was started on procardia and did well on this.   Magnesium Sulfate received: No BMZ received: No Rhophylac:N/A Transfusion:No  Physical exam  Vitals:   05/02/21 1136 05/02/21 1542 05/02/21 2259 05/03/21 0425  BP:  (!) 122/58 123/74 124/63  Pulse:  (!) 102 (!) 104 89  Resp:  18 19   Temp: 98 F (36.7 C) 98.4 F (36.9 C) 98.6 F (37 C) 97.9 F (36.6 C)  TempSrc:   Oral Oral  SpO2: 99% 99% 100% 100%   General: alert Lochia: appropriate Uterine Fundus: nttp, firm Incision: N/A  Labs: Lab Results  Component Value Date   WBC 22.4 (H) 05/01/2021   HGB 9.7 (L) 05/01/2021   HCT 30.3 (L) 05/01/2021   MCV 92.7 05/01/2021   PLT 266 05/01/2021   CMP Latest Ref Rng & Units 05/01/2021  Glucose 70 - 99 mg/dL 536(U)  BUN 6 - 20 mg/dL 6  Creatinine 4.40 - 3.47 mg/dL 4.25  Sodium 956 - 387 mmol/L 130(L)  Potassium 3.5 - 5.1 mmol/L 3.8  Chloride 98 - 111 mmol/L 101  CO2 22 - 32 mmol/L 21(L)  Calcium 8.9 - 10.3 mg/dL 5.6(E)  Total Protein 6.5 - 8.1 g/dL 3.3(I)  Total Bilirubin 0.3 - 1.2 mg/dL 0.6  Alkaline Phos 38 - 126 U/L 129(H)  AST 15 - 41 U/L 24  ALT 0 - 44 U/L 10   Edinburgh Score: No flowsheet data found.    After visit meds:  Allergies as of 05/03/2021      Reactions   Red Dye Anaphylaxis      Medication List     STOP taking these medications   Doxylamine-Pyridoxine 10-10 MG Tbec Commonly known as: Diclegis     TAKE these medications   ibuprofen 600 MG tablet Commonly known as: ADVIL Take 1 tablet (600 mg total) by mouth every 6 (six) hours as needed.   NIFEdipine 30 MG 24 hr tablet Commonly known as: ADALAT CC Take 1 tablet (30 mg total) by mouth daily.   Vitafol Gummies 3.33-0.333-34.8 MG Chew Take three gummies in the AM        Discharge home in stable condition Infant Feeding: Breast Infant Disposition:NICU Discharge instruction: per After Visit Summary and Postpartum booklet. Activity: Advance as tolerated. Pelvic rest for 6 weeks.  Diet: routine diet Anticipated Birth Control: not discussed with mom Postpartum Appointment:1 week Additional Postpartum F/U: BP check 1 week Future Appointments: Future Appointments  Date Time Provider Department Center  05/05/2021  1:30 PM [facility] NST ROOM [facility] None  05/05/2021  2:30 PM Hildred Laser, MD [facility] None   Follow up Visit:  Follow-up Information    [facility]. Call in 3  day(s).   Specialty: Obstetrics and Gynecology Why: if you have not heard about your appointment for this upcoming friday to check your blood pressure Contact information: [address] [place] [address] [phone]             Patient states she desired follow up with [place] because she lives in [place] and baby is here in [place]    05/03/2021 [place] Bing, MD

## 2021-05-03 NOTE — Lactation Note (Signed)
This note was copied from a baby's chart. Lactation Consultation Note  Patient Name: Emily Bullock [id number] Date: 05/03/2021 Reason for consult: Follow-up assessment;Primapara;1st time breastfeeding;Term;NICU baby Age:27 hours  LC in to visit with P1 Mom of term infant in the NICU.  Mom is currently NPO on cooling blanket for HIE.  Mom states she plans to begin pumping when she is discharged from [facility] and moves to NICU.  She will have baby's RN call LC for assistance with pumping for the first time.   Lactation Tools Discussed/Used Tools: Pump Breast pump type: Double-Electric Breast Pump Pumping frequency: 0 (Mom plans to begin once she is discharged from [facility] and moves to baby's room in NICU) Pumped volume: 0 mL  Interventions Interventions: Education;DEBP  Discharge Discharge Education: Engorgement and breast care Pump: DEBP WIC Program: Yes  Consult Status Consult Status: Follow-up Date: 05/04/21 Follow-up type: In-patient    Emily Bullock 05/03/2021, 9:53 AM

## 2021-05-03 NOTE — Discharge Instructions (Signed)
Hypertension During Pregnancy Hypertension is also called high blood pressure. High blood pressure means that the force of your blood moving in your body is too strong. It can cause problems for you and your baby. Different types of high blood pressure can happen during pregnancy. The types are:  High blood pressure before you got pregnant. This is called chronic hypertension.  This can continue during your pregnancy. Your doctor will want to keep checking your blood pressure. You may need medicine to keep your blood pressure under control while you are pregnant. You will need follow-up visits after you have your baby.  High blood pressure that goes up during pregnancy when it was normal before. This is called gestational hypertension. It will usually get better after you have your baby, but your doctor will need to watch your blood pressure to make sure that it is getting better.  Very high blood pressure during pregnancy. This is called preeclampsia. Very high blood pressure is an emergency that needs to be checked and treated right away.  You may develop very high blood pressure after giving birth. This is called postpartum preeclampsia. This usually occurs within 48 hours after childbirth but may occur up to 6 weeks after giving birth. This is rare. How does this affect me? If you have high blood pressure during pregnancy, you have a higher chance of developing high blood pressure:  As you get older.  If you get pregnant again. In some cases, high blood pressure during pregnancy can cause:  Stroke.  Heart attack.  Damage to the kidneys, lungs, or liver.  Preeclampsia.  Jerky movements you cannot control (convulsions or seizures).  Problems with the placenta.   What can I do to lower my risk?   Keep a healthy weight.  Eat a healthy diet.  Follow what your doctor tells you about treating any medical problems that you had before becoming pregnant. It is very important to go to  all of your doctor visits. Your doctor will check your blood pressure and make sure that your pregnancy is progressing as it should. Treatment should start early if a problem is found.   Follow these instructions at home:  Take your blood pressure 1-2 times per day. Call the office if your blood pressure is 155 or higher for the top number or 105 or higher for the bottom number.    Eating and drinking   Drink enough fluid to keep your pee (urine) pale yellow.  Avoid caffeine. Lifestyle  Do not use any products that contain nicotine or tobacco, such as cigarettes, e-cigarettes, and chewing tobacco. If you need help quitting, ask your doctor.  Do not use alcohol or drugs.  Avoid stress.  Rest and get plenty of sleep.  Regular exercise can help. Ask your doctor what kinds of exercise are best for you. General instructions  Take over-the-counter and prescription medicines only as told by your doctor.  Keep all prenatal and follow-up visits as told by your doctor. This is important. Contact a doctor if:  You have symptoms that your doctor told you to watch for, such as: ? Headaches. ? Nausea. ? Vomiting. ? Belly (abdominal) pain. ? Dizziness. ? Light-headedness. Get help right away if:  You have: ? Very bad belly pain that does not get better with treatment. ? A very bad headache that does not get better. ? Vomiting that does not get better. ? Sudden, fast weight gain. ? Sudden swelling in your hands, ankles, or face. ?   Blood in your pee. ? Blurry vision. ? Double vision. ? Shortness of breath. ? Chest pain. ? Weakness on one side of your body. ? Trouble talking. Summary  High blood pressure is also called hypertension.  High blood pressure means that the force of your blood moving in your body is too strong.  High blood pressure can cause problems for you and your baby.  Keep all follow-up visits as told by your doctor. This is important. This information is  not intended to replace advice given to you by your health care provider. Make sure you discuss any questions you have with your health care provider. Document Released: 12/18/2010 Document Revised: 03/08/2019 Document Reviewed: 12/12/2018 Elsevier Patient Education  2020 Elsevier Inc.    Vaginal Delivery, Care After Refer to this sheet in the next few weeks. These discharge instructions provide you with information on caring for yourself after delivery. Your caregiver may also give you specific instructions. Your treatment has been planned according to the most current medical practices available, but problems sometimes occur. Call your caregiver if you have any problems or questions after you go home. HOME CARE INSTRUCTIONS 1. Take over-the-counter or prescription medicines only as directed by your caregiver or pharmacist. 2. Do not drink alcohol, especially if you are breastfeeding or taking medicine to relieve pain. 3. Do not smoke tobacco. 4. Continue to use good perineal care. Good perineal care includes: 1. Wiping your perineum from back to front 2. Keeping your perineum clean. 3. You can do sitz baths twice a day, to help keep this area clean 5. Do not use tampons, douche or have sex until your caregiver says it is okay. 6. Shower only and avoid sitting in submerged water, aside from sitz baths 7. Wear a well-fitting bra that provides breast support. 8. Eat healthy foods. 9. Drink enough fluids to keep your urine clear or pale yellow. 10. Eat high-fiber foods such as whole grain cereals and breads, brown rice, beans, and fresh fruits and vegetables every day. These foods may help prevent or relieve constipation. 11. Avoid constipation with high fiber foods or medications, such as miralax or metamucil 12. Follow your caregiver's recommendations regarding resumption of activities such as climbing stairs, driving, lifting, exercising, or traveling. 13. Talk to your caregiver about  resuming sexual activities. Resumption of sexual activities is dependent upon your risk of infection, your rate of healing, and your comfort and desire to resume sexual activity. 14. Try to have someone help you with your household activities and your newborn for at least a few days after you leave the hospital. 15. Rest as much as possible. Try to rest or take a nap when your newborn is sleeping. 16. Increase your activities gradually. 17. Keep all of your scheduled postpartum appointments. It is very important to keep your scheduled follow-up appointments. At these appointments, your caregiver will be checking to make sure that you are healing physically and emotionally. SEEK MEDICAL CARE IF:   You are passing large clots from your vagina. Save any clots to show your caregiver.  You have a foul smelling discharge from your vagina.  You have trouble urinating.  You are urinating frequently.  You have pain when you urinate.  You have a change in your bowel movements.  You have increasing redness, pain, or swelling near your vaginal incision (episiotomy) or vaginal tear.  You have pus draining from your episiotomy or vaginal tear.  Your episiotomy or vaginal tear is separating.  You have painful,   hard, or reddened breasts.  You have a severe headache.  You have blurred vision or see spots.  You feel sad or depressed.  You have thoughts of hurting yourself or your newborn.  You have questions about your care, the care of your newborn, or medicines.  You are dizzy or light-headed.  You have a rash.  You have nausea or vomiting.  You were breastfeeding and have not had a menstrual period within 12 weeks after you stopped breastfeeding.  You are not breastfeeding and have not had a menstrual period by the 12th week after delivery.  You have a fever. SEEK IMMEDIATE MEDICAL CARE IF:   You have persistent pain.  You have chest pain.  You have shortness of breath.  You  faint.  You have leg pain.  You have stomach pain.  Your vaginal bleeding saturates two or more sanitary pads in 1 hour. MAKE SURE YOU:   Understand these instructions.  Will watch your condition.  Will get help right away if you are not doing well or get worse. Document Released: 11/12/2000 Document Revised: 04/01/2014 Document Reviewed: 07/12/2012 ExitCare Patient Information 2015 [employer]. This information is not intended to replace advice given to you by your health care provider. Make sure you discuss any questions you have with your health care provider.  Sitz Bath A sitz bath is a warm water bath taken in the sitting position. The water covers only the hips and butt (buttocks). We recommend using one that fits in the toilet, to help with ease of use and cleanliness. It may be used for either healing or cleaning purposes. Sitz baths are also used to relieve pain, itching, or muscle tightening (spasms). The water may contain medicine. Moist heat will help you heal and relax.  HOME CARE  Take 3 to 4 sitz baths a day. 18. Fill the bathtub half-full with warm water. 19. Sit in the water and open the drain a little. 20. Turn on the warm water to keep the tub half-full. Keep the water running constantly. 21. Soak in the water for 15 to 20 minutes. 22. After the sitz bath, pat the affected area dry. GET HELP RIGHT AWAY IF: You get worse instead of better. Stop the sitz baths if you get worse. MAKE SURE YOU:  Understand these instructions.  Will watch your condition.  Will get help right away if you are not doing well or get worse. Document Released: 12/23/2004 Document Revised: 08/09/2012 Document Reviewed: 03/15/2011 ExitCare Patient Information 2015 [employer]. This information is not intended to replace advice given to you by your health care provider. Make sure you discuss any questions you have with your health care provider.    

## 2021-05-03 NOTE — Lactation Note (Signed)
This note was copied from a baby's chart. Lactation Consultation Note  Patient Name: Emily Bullock [id number] Date: 05/03/2021   Age:27 hours   LC in to assist P1 Mom to initiate double pumping at baby's bedside.  21 mm flanges provided.  Elastic band provide (L) to create a hand's free pumping bra.   Demonstrated initiation setting on pump.   Mom understands how to detach flange from connector part for colostrum drop to be collected to swab in baby's mouth for oral care.  Praised Mom for her commitment to providing colostrum for her baby.  Reviewed how to disassemble pump parts, wash, rinse and air dry in separate bin provided.   Encouraged Mom to pump every 2 hrs, adding breast massage and hand expression to stimulate more milk removal.   Mom aware of lactation support available to her while baby is in NICU.  Mom to ask for assistance prn.   [name] 05/03/2021, 3:42 PM

## 2021-05-04 ENCOUNTER — Ambulatory Visit: Payer: Self-pay

## 2021-05-04 NOTE — Lactation Note (Signed)
This note was copied from a baby's chart. Lactation Consultation Note  Patient Name: Girl Jacqulynn Shappell [id number] Date: 05/04/2021 Reason for consult: NICU baby;Follow-up assessment Age:27 hours  Maternal Data  Mother has increased pumping frequency.  Possible delay in onset of copious milk   Feeding Mother's Current Feeding Choice: Breast Milk and Formula  Interventions Interventions: Education;Breast feeding basics reviewed   Consult Status Consult Status: Follow-up Follow-up type: In-patient   Elder Negus, MA IBCLC 05/04/2021, 5:59 PM

## 2021-05-04 NOTE — Anesthesia Postprocedure Evaluation (Signed)
Anesthesia Post Note  Patient: Emily Bullock  Procedure(s) Performed: AN AD HOC LABOR EPIDURAL  Anesthesia Type: Epidural Anesthetic complications: no Comments: Pt discharged prior to being seen.   No complications documented.   Last Vitals: There were no vitals filed for this visit.  Last Pain: There were no vitals filed for this visit.               Reniyah Gootee K

## 2021-05-04 NOTE — Lactation Note (Signed)
This note was copied from a baby's chart. Lactation Consultation Note LC provided f/u visit in mother's room. Reinforced earlier teaching and provided 81mm flanges.   Patient Name: Emily Bullock [id number] Date: 05/04/2021   Age:27 hours   Elder Negus, MA IBCLC 05/04/2021, 7:15 AM

## 2021-05-05 ENCOUNTER — Inpatient Hospital Stay: Admit: 2021-05-05 | Payer: Self-pay

## 2021-05-05 ENCOUNTER — Encounter: Payer: Medicaid Other | Admitting: Obstetrics and Gynecology

## 2021-05-05 ENCOUNTER — Other Ambulatory Visit: Payer: Medicaid Other

## 2021-05-05 LAB — SURGICAL PATHOLOGY

## 2021-05-05 NOTE — Interval H&P Note (Signed)
History and Physical Interval Note:  05/05/2021 12:18 PM  Emily Bullock  delivered at [facility] on 05/01/2021. Received call from Dr. Logan Bores (delivering provider and attending at time of delivery) who reported uncomplicated delivery with no prenatal issues, however infant was currently undergoing cooling and venting and would require transfer to [facility] NICU. He reported no problems with bleeding or other post delivery issues. He requested transfer to [facility] so that patient could be close to her infant and visit frequently in the NICU. I accepted the transfer, patient arrived after I had signed out care of labor/postpartum floors to Dr. Charlotta Newton.      [name]

## 2021-05-06 ENCOUNTER — Ambulatory Visit: Payer: Self-pay

## 2021-05-06 DIAGNOSIS — O165 Unspecified maternal hypertension, complicating the puerperium: Secondary | ICD-10-CM | POA: Diagnosis present

## 2021-05-06 NOTE — Lactation Note (Signed)
This note was copied from a baby's chart. Lactation Consultation Note Mother is pumping infrequently. Reviewed risks of engorgement and low milk supply.  Patient Name: Emily Bullock [id number] Date: 05/06/2021 Reason for consult: Follow-up assessment Age:27 days  Feeding Mother's Current Feeding Choice: Breast Milk and Donor Milk   Lactation Tools Discussed/Used Flange Size: 24;27 (re-sized) Pump Education: Other (comment) (importance of frequent breast stimulation) Pumping frequency: 2x Pumped volume: 15 mL  Interventions Interventions: Education;DEBP;Expressed milk  Discharge Discharge Education: Engorgement and breast care Pump: DEBP  Consult Status Consult Status: Follow-up Follow-up type: In-patient   Elder Negus, MA IBCLC 05/06/2021, 9:05 AM

## 2021-05-07 ENCOUNTER — Ambulatory Visit: Payer: Self-pay

## 2021-05-07 NOTE — Lactation Note (Signed)
This note was copied from a baby's chart. Lactation Consultation Note  Patient Name: Emily Bullock [id number] Date: 05/07/2021 Reason for consult: Follow-up assessment;Mother's request;Primapara;1st time breastfeeding;Term Age:27 days  Mom requesting LC support.   Mom trying to pump more often.   Mom concerned about her milk supply.  Mom has expressed 15 ml but today it has decreased to 5 ml.  Mom concerned she is engorged.  Has a head pack on her left outer breast.   Mom is not engorged, breasts compressible.  Mom to pump more often, every 2-3 hrs during the day and 3-4 hrs at night, the best she can.  Hand's free pumping band provided and Mom to massage and compress during pumping. Mom encouraged to pump after she is able to do STS with baby, currently baby is not able to be held.  Reviewed importance of disassembling pump parts, washing, rinsing and air drying in separate bin away from sink. LC washed parts for Mom.   Lactation Tools Discussed/Used Tools: Pump;Flanges Breast pump type: Double-Electric Breast Pump Pump Education: Setup, frequency, and cleaning;Milk Storage Pumping frequency: 4 times per 24 hrs Pumped volume: 5 mL  Interventions Interventions: Breast massage;Hand express;DEBP;Education  Discharge Discharge Education: Engorgement and breast care  Consult Status Consult Status: Follow-up Date: 05/08/21 Follow-up type: In-patient    [name] 05/07/2021, 2:41 PM

## 2021-05-08 ENCOUNTER — Ambulatory Visit: Payer: Medicaid Other

## 2021-05-08 ENCOUNTER — Ambulatory Visit: Payer: Self-pay

## 2021-05-08 NOTE — Lactation Note (Signed)
This note was copied from a baby's chart. Lactation Consultation Note  Patient Name: Emily Bullock Date: 05/08/2021 Reason for consult: Follow-up assessment;NICU baby Age:27 days  Follow up visit to 67 days old infant. Both parents are present during consult. Mother reports she is only able to get breast milk from right breast. LC checked left breast, it seems soft and pliable upon touch, no engorgement signs.  Mother states she is collecting ~30 mL of EBM per pumping from right breast. Mother explains she collected ~4oz last night. Mother is pumping Q2, discussed pumping Q3 to allow more time for resting if needed.   Encouraged to request [facility] for any needs, support or questions. Praised mother for her milk supply, effort and dedication.   Feeding Mother's Current Feeding Choice: Breast Milk and Donor Milk  Lactation Tools Discussed/Used Tools: Pump;Flanges Flange Size: 24;27 Breast pump type: Double-Electric Breast Pump Reason for Pumping: nicu baby Pumping frequency: Q3 Pumped volume: 30 mL  Interventions Interventions: Breast feeding basics reviewed;Expressed milk;Coconut oil;DEBP;Education  Consult Status Consult Status: Follow-up Date: 05/09/21 Follow-up type: In-patient    Emily Bullock 05/08/2021, 4:25 PM

## 2021-05-09 ENCOUNTER — Ambulatory Visit: Payer: Self-pay

## 2021-05-09 NOTE — Lactation Note (Signed)
This note was copied from a baby's chart. Lactation Consultation Note Mother at risk for low milk supply.   Patient Name: Emily Bullock [id number] Date: 05/09/2021 Reason for consult: NICU baby;Follow-up assessment Age:27 days  Maternal Data  Pumping 4x day Hx breast L breast injury  Feeding Mother's Current Feeding Choice: Breast Milk   Lactation Tools Discussed/Used Pumping frequency: 4x Pumped volume: 60 mL  Interventions Interventions: Education  Consult Status Consult Status: Follow-up Follow-up type: In-patient   Elder Negus, MA IBCLC 05/09/2021, 3:13 PM

## 2021-05-11 ENCOUNTER — Ambulatory Visit (INDEPENDENT_AMBULATORY_CARE_PROVIDER_SITE_OTHER): Payer: Medicaid Other | Admitting: General Practice

## 2021-05-11 ENCOUNTER — Other Ambulatory Visit: Payer: Self-pay

## 2021-05-11 VITALS — BP 129/89 | HR 86 | Ht 62.0 in | Wt 188.0 lb

## 2021-05-11 DIAGNOSIS — Z0130 Encounter for examination of blood pressure without abnormal findings: Secondary | ICD-10-CM

## 2021-05-11 NOTE — Progress Notes (Signed)
Patient presents to office today for blood pressure check following delivery on 6/3- patient developed mild pre-e post delivery. She reports occasional headaches & dizziness but feels it is likely related to stress and minimal sleep- patient has been staying in NICU with the baby since delivery. She is taking her procardia daily in the evening. No edema noted today. BP 126/91 & 129/89- reviewed with Dr Macon Large who states patient should continue Procardia and follow up for postpartum visit with OB provider. Discussed with patient & precautions given.  Chase Caller RN BSN 05/11/21

## 2021-05-11 NOTE — Progress Notes (Signed)
Patient was assessed and managed by nursing staff during this encounter. I have reviewed the chart and agree with the documentation and plan.   Sagal Gayton, MD 05/11/2021 11:13 AM 

## 2021-05-13 ENCOUNTER — Ambulatory Visit: Payer: Self-pay

## 2021-05-13 NOTE — Lactation Note (Signed)
This note was copied from a baby's chart. Lactation Consultation Note  Patient Name: Girl Zaidy Absher [id number] Date: 05/13/2021 Reason for consult: Follow-up assessment Age:27 days  Maternal Data  Mother with low milk supply Hx left breast trauma and no milk production @ 12 days pp on that side Mother to discontinue pumping L breast. Counseled on R breast's ability to compensate and produce sufficient supply with increase in pumping.    Feeding Mother's Current Feeding Choice: Breast Milk   Lactation Tools Discussed/Used Pumping frequency: 5x Pumped volume: 45 mL   Consult Status Consult Status: Follow-up Follow-up type: In-patient   Elder Negus, MA IBCLC 05/13/2021, 4:08 PM

## 2021-05-14 ENCOUNTER — Other Ambulatory Visit: Payer: Self-pay

## 2021-05-14 ENCOUNTER — Ambulatory Visit (INDEPENDENT_AMBULATORY_CARE_PROVIDER_SITE_OTHER): Payer: Medicaid Other | Admitting: Obstetrics and Gynecology

## 2021-05-14 ENCOUNTER — Encounter: Payer: Self-pay | Admitting: Obstetrics and Gynecology

## 2021-05-14 NOTE — Progress Notes (Signed)
HPI:      Emily Bullock is a 25 y.o. G1P1001 who LMP was No LMP recorded.  Subjective:   She presents today 2 weeks postpartum.  She says that despite a poor MRI finding her baby is doing very well.  Breathing room air, breast-feeding.  The baby will be transferred back to [facility] NICU to be closer to mom and dad prior to discharge. Emily Bullock says she is doing well.  Has spent most of her time at the hospital in [place] with her baby.  She has no  physical complaints today.    Hx: The following portions of the patient's history were reviewed and updated as appropriate:             She  has a past medical history of Medical history non-contributory. She does not have any pertinent problems on file. She  has a past surgical history that includes Tonsillectomy and Wisdom tooth extraction. Her family history includes Breast cancer in her mother; Hypertension in her mother. She  reports that she has never smoked. She has never used smokeless tobacco. She reports previous alcohol use. She reports previous drug use. Drug: Marijuana. She has a current medication list which includes the following prescription(s): ibuprofen, nifedipine, and vitafol gummies. She is allergic to red dye.       Review of Systems:  Review of Systems  Constitutional: Denied constitutional symptoms, night sweats, recent illness, fatigue, fever, insomnia and weight loss.  Eyes: Denied eye symptoms, eye pain, photophobia, vision change and visual disturbance.  Ears/Nose/Throat/Neck: Denied ear, nose, throat or neck symptoms, hearing loss, nasal discharge, sinus congestion and sore throat.  Cardiovascular: Denied cardiovascular symptoms, arrhythmia, chest pain/pressure, edema, exercise intolerance, orthopnea and palpitations.  Respiratory: Denied pulmonary symptoms, asthma, pleuritic pain, productive sputum, cough, dyspnea and wheezing.  Gastrointestinal: Denied, gastro-esophageal reflux, melena, nausea and vomiting.   Genitourinary: Denied genitourinary symptoms including symptomatic vaginal discharge, pelvic relaxation issues, and urinary complaints.  Musculoskeletal: Denied musculoskeletal symptoms, stiffness, swelling, muscle weakness and myalgia.  Dermatologic: Denied dermatology symptoms, rash and scar.  Neurologic: Denied neurology symptoms, dizziness, headache, neck pain and syncope.  Psychiatric: Denied psychiatric symptoms, anxiety and depression.  Endocrine: Denied endocrine symptoms including hot flashes and night sweats.   Meds:   Current Outpatient Medications on File Prior to Visit  Medication Sig Dispense Refill   ibuprofen (ADVIL) 600 MG tablet Take 1 tablet (600 mg total) by mouth every 6 (six) hours as needed. 30 tablet 0   NIFEdipine (ADALAT CC) 30 MG 24 hr tablet Take 1 tablet (30 mg total) by mouth daily. 42 tablet 0   Prenatal Vit-Fe Phos-FA-Omega (VITAFOL GUMMIES) 3.33-0.333-34.8 MG CHEW Take three gummies in the AM 90 tablet 11   No current facility-administered medications on file prior to visit.          Objective:     Vitals:   05/14/21 1450  BP: 134/87  Pulse: 85   Filed Weights   05/14/21 1450  Weight: 189 lb 14.4 oz (86.1 kg)                Assessment:    G1P1001 Patient Active Problem List   Diagnosis Date Noted   Postpartum hypertension 05/06/2021   Intrauterine normal pregnancy 05/02/2021   Term pregnancy 04/30/2021   Indication for care in labor or delivery 04/30/2021   Normal labor 04/30/2021   Irregular uterine contractions    Labor and delivery indication for care or intervention 02/05/2021  1. Postpartum care and examination immediately after delivery    Dereck Ligas is doing well both physically and mentally. (See depression scoring)   Plan:            1.  Follow-up in 4 weeks for postpartum check. Orders No orders of the defined types were placed in this encounter.   No orders of the defined types were placed in this  encounter.     F/U  Return in about 4 weeks (around 06/11/2021).  Elonda Husky, M.D. 05/14/2021 3:15 PM

## 2021-05-15 ENCOUNTER — Ambulatory Visit: Payer: Self-pay

## 2021-05-15 NOTE — Lactation Note (Signed)
This note was copied from a baby's chart. Lactation Consultation Note R breast volume has increased, as expected. LC team at [facility] to assume care.  Patient Name: Girl Joesphine Schemm [id number] Date: 05/15/2021 Reason for consult: Follow-up assessment;Other (Comment) (infant transfer to [facility]) Age:27 wk.o.  Feeding Mother's Current Feeding Choice: Breast Milk and Donor Milk   Lactation Tools Discussed/Used Pumping frequency: 4-5x (R breast only)   Consult Status Consult Status: Follow-up Follow-up type: In-patient   Elder Negus, MA IBCLC 05/15/2021, 9:27 AM

## 2021-05-15 NOTE — Lactation Note (Signed)
This note was copied from a baby's chart. Lactation Consultation Note  Patient Name: Emily Bullock [id number] Date: 05/15/2021 Reason for consult: Initial assessment;NICU baby;Term (transferom WCC) Age:27 wk.o.  Maternal Data    Feeding Mother's Current Feeding Choice: Breast Milk and Donor Milk, baby sleepy so mom did not attempt breastfeed, has been attempting breast with nipple shield while baby receiving tube feed, mom given a 24 mm nipple shield as she was unsure of size, flat nipples, is not pumping left breast as no production on that side despite assist from LC's at [facility], stated that she had trauma to that breast, she gets full bottle 4oz, in am and at night, only small amts during day.    LATCH Score                    Lactation Tools Discussed/Used Breast pump type: Double-Electric Breast Pump Pump Education: Setup, frequency, and cleaning Reason for Pumping: to provide milk for baby in SCN Pumping frequency: mom states she pumps right breast 4-5x /day, encouraged to increase to 8x/day  Interventions  Medela nipple shield instruction sheet given  Discharge Pump: WIC Loaner;DEBP WIC Program: Yes To obtain WIC pump today for home use Consult Status Consult Status: PRN Date: 05/15/21 Follow-up type: In-patient    [name] 05/15/2021, 4:06 PM

## 2021-05-18 ENCOUNTER — Ambulatory Visit: Payer: Self-pay

## 2021-05-18 NOTE — Lactation Note (Signed)
This note was copied from a baby's chart. Lactation Consultation Note  Patient Name: Emily Bullock [id number] Date: 05/18/2021 Reason for consult: Follow-up assessment;Primapara;NICU baby Age:27 wk.o.  Lactation at bed space 05 in SCN. Baby is coming to partially pumped breast with use of nipple shield d/t flat nipples. Mom pumped for half empty breast this morning at the 0830 feeding, and per SLP baby appeared to have been able to tolerate more.  This feeding mom pre-pumped for 5-8 minutes through first let-down. Baby brought to breast in cradle hold with use of nipple shield. Baby able to maintain latch, audible swallows, paced well with all stats remaining at appropriate levels. After 15 minutes baby was sleepy, and removed from breast satiated. Baby was placed skin to skin with mom with warm blankets. RN updated to determine how much for NG feeding remains.  Plan: -Pre pump through first let-down 5-8 minutes, put baby to breast and allow to feed as long as awake and alert.  -Pump throughout the night every 3 hours. -Tomorrow feedings without pre-pumping planned. Possible pumping post feeds to ensure full emptying.  Call lactation to bedside for support as needed.  Maternal Data Has patient been taught Hand Expression?: Yes Does the patient have breastfeeding experience prior to this delivery?: No  Feeding Mother's Current Feeding Choice: Breast Milk  LATCH Score Latch: Grasps breast easily, tongue down, lips flanged, rhythmical sucking.  Audible Swallowing: A few with stimulation  Type of Nipple: Flat  Comfort (Breast/Nipple): Soft / non-tender  Hold (Positioning): Assistance needed to correctly position infant at breast and maintain latch.  LATCH Score: 7   Lactation Tools Discussed/Used Tools: Nipple Shields Nipple shield size: 20 Breast pump type: Double-Electric Breast Pump Reason for Pumping: NICU Pumping frequency: q 3 hrs, and prepump before  feedings  Interventions Interventions: Breast feeding basics reviewed;Assisted with latch;Skin to skin;Adjust position;Support pillows;Education  Discharge Pump: Personal;DEBP  Consult Status Consult Status: Follow-up Date: 05/18/21 Follow-up type: Call as needed    Danford Bad 05/18/2021, 12:01 PM

## 2021-05-19 ENCOUNTER — Ambulatory Visit: Payer: Self-pay

## 2021-05-19 NOTE — Lactation Note (Signed)
This note was copied from a baby's chart. Lactation Consultation Note  Patient Name: Emily Bullock [id number] Date: 05/19/2021 Reason for consult: Follow-up assessment;NICU baby;Primapara;Term Age:27 wk.o.  Lactation at bedside to assist with feeding on full breast. Baby was sleepy during 0830 attempt, this is the first time feeding on full breast. Mom assisted with comfortable position and support pillows and blanket. Nipple shield placed; baby brought to breast for cross-cradle hold. Baby was eager, a little frustrated at first in anticipation of let-down. Once milk let down baby sustained latch, maintained alertness, and fed for solid 20 minutes before becoming sleepy. Milk noted in nipple shield when baby was removed from the breast. No supplement via NG tube given at this time. Feeding plan is to continue on full breasts for 2:30pm and 5:30pm feedings.  Maternal Data Has patient been taught Hand Expression?: Yes Does the patient have breastfeeding experience prior to this delivery?: No  Feeding Mother's Current Feeding Choice: Breast Milk  LATCH Score Latch: Grasps breast easily, tongue down, lips flanged, rhythmical sucking.  Audible Swallowing: Spontaneous and intermittent  Type of Nipple: Everted at rest and after stimulation  Comfort (Breast/Nipple): Soft / non-tender  Hold (Positioning): Assistance needed to correctly position infant at breast and maintain latch.  LATCH Score: 9   Lactation Tools Discussed/Used Tools: Nipple Shields Nipple shield size: 24  Interventions Interventions: Breast feeding basics reviewed;Assisted with latch;Breast compression;Support pillows;Education  Discharge Pump: Personal  Consult Status Consult Status: Follow-up Date: 05/20/21 Follow-up type: Call as needed    Danford Bad 05/19/2021, 12:04 PM

## 2021-05-22 ENCOUNTER — Ambulatory Visit: Payer: Self-pay

## 2021-05-22 NOTE — Lactation Note (Signed)
This note was copied from a baby's chart. Lactation Consultation Note  Patient Name: Emily Bullock [id number] Date: 05/22/2021 Reason for consult: Follow-up assessment;NICU baby Age:27 wk.o.  Maternal Data    Feeding Mother's Current Feeding Choice: Breast Milk Observed feeding at breast, baby sleepy at prior feed, more alert this time, needs some stimulation, audible swallows, mom latched with little assist from [facility] nurse LATCH Score Latch: Grasps breast easily, tongue down, lips flanged, rhythmical sucking.  Audible Swallowing: Spontaneous and intermittent  Type of Nipple: Everted at rest and after stimulation  Comfort (Breast/Nipple): Soft / non-tender  Hold (Positioning): Assistance needed to correctly position infant at breast and maintain latch.  LATCH Score: 9   Lactation Tools Discussed/Used Nipple shield size: 20 Pumping frequency: q3h Pumped volume: 3 mL  Interventions Interventions: Support pillows  Discharge    Consult Status Consult Status: PRN Date: 05/22/21 Follow-up type: In-patient    [name] 05/22/2021, 4:38 PM

## 2021-06-03 ENCOUNTER — Other Ambulatory Visit: Payer: Self-pay

## 2021-06-03 ENCOUNTER — Ambulatory Visit
Admission: EM | Admit: 2021-06-03 | Discharge: 2021-06-03 | Disposition: A | Discharge status: Home / Self Care | Payer: Medicaid Other | Attending: Emergency Medicine | Admitting: Emergency Medicine

## 2021-06-03 DIAGNOSIS — J00 Acute nasopharyngitis [common cold]: Secondary | ICD-10-CM | POA: Insufficient documentation

## 2021-06-03 LAB — GROUP A STREP BY PCR: Group A Strep by PCR: NOT DETECTED

## 2021-06-03 MED ORDER — IPRATROPIUM BROMIDE 0.06 % NA SOLN: 2.0000 | Freq: Four times a day (QID) | NASAL | 12 refills | Status: AC

## 2021-06-03 NOTE — Discharge Instructions (Addendum)
Use the Atrovent nasal spray, 2 squirts in each nostril every 6 hours, as needed for runny nose and postnasal drip.  Return for reevaluation or see your primary care provider for any new or worsening symptoms.  

## 2021-06-03 NOTE — ED Triage Notes (Signed)
Patient states that she has been having a sore throat since yesterday.

## 2021-06-03 NOTE — ED Provider Notes (Signed)
[facility]    CSN: [id number] Arrival date & time: 06/03/21  1051      History   Chief Complaint Chief Complaint  Patient presents with   Sore Throat    HPI Emily Bullock is a 25 y.o. female.   HPI  25 year old female here for evaluation of sore throat.  Patient reports that she developed a sore throat yesterday.  She is also been experiencing some nasal congestion and runny nose but only in the morning when she wakes up as well as a slight nonproductive cough in the morning.  She states her throat is scratchy feeling and not sharp.  She denies fever, ear pain or pressure, or sick contacts.  Past Medical History:  Diagnosis Date   Medical history non-contributory     Patient Active Problem List   Diagnosis Date Noted   Postpartum hypertension 05/06/2021   Intrauterine normal pregnancy 05/02/2021   Term pregnancy 04/30/2021   Indication for care in labor or delivery 04/30/2021   Normal labor 04/30/2021   Irregular uterine contractions    Labor and delivery indication for care or intervention 02/05/2021    Past Surgical History:  Procedure Laterality Date   TONSILLECTOMY     WISDOM TOOTH EXTRACTION      OB History     Gravida  1   Para  1   Term  1   Preterm      AB      Living  1      SAB      IAB      Ectopic      Multiple  0   Live Births  1            Home Medications    Prior to Admission medications   Medication Sig Start Date End Date Taking? Authorizing Provider  ipratropium (ATROVENT) 0.06 % nasal spray Place 2 sprays into both nostrils 4 (four) times daily. 06/03/21  Yes Becky Augusta, NP    Family History Family History  Problem Relation Age of Onset   Hypertension Mother    Breast cancer Mother     Social History Social History   Tobacco Use   Smoking status: Never   Smokeless tobacco: Never  Vaping Use   Vaping Use: Former  Substance Use Topics   Alcohol use: Not Currently   Drug use: Not  Currently    Types: Marijuana     Allergies   Red dye   Review of Systems Review of Systems  Constitutional:  Negative for fever.  HENT:  Positive for congestion, rhinorrhea and sore throat. Negative for ear pain.   Respiratory:  Positive for cough. Negative for shortness of breath and wheezing.   Hematological: Negative.   Psychiatric/Behavioral: Negative.      Physical Exam Triage Vital Signs ED Triage Vitals  Enc Vitals Group     BP 06/03/21 1130 (!) 128/98     Pulse Rate 06/03/21 1130 88     Resp 06/03/21 1130 18     Temp 06/03/21 1130 98.7 F (37.1 C)     Temp Source 06/03/21 1130 Oral     SpO2 06/03/21 1130 100 %     Weight 06/03/21 1128 180 lb (81.6 kg)     Height 06/03/21 1128 5\' 2"  (1.575 m)     Head Circumference --      Peak Flow --      Pain Score 06/03/21 1127 3     Pain Loc --  Pain Edu? --      Excl. in GC? --    No data found.  Updated Vital Signs BP (!) 128/98 (BP Location: Right Arm)   Pulse 88   Temp 98.7 F (37.1 C) (Oral)   Resp 18   Ht 5\' 2"  (1.575 m)   Wt 180 lb (81.6 kg)   SpO2 100%   Breastfeeding Yes   BMI 32.92 kg/m   Visual Acuity Right Eye Distance:   Left Eye Distance:   Bilateral Distance:    Right Eye Near:   Left Eye Near:    Bilateral Near:     Physical Exam Vitals and nursing note reviewed.  Constitutional:      General: She is not in acute distress.    Appearance: Normal appearance. She is obese. She is not ill-appearing.  HENT:     Head: Normocephalic and atraumatic.     Right Ear: Tympanic membrane, ear canal and external ear normal. There is no impacted cerumen.     Left Ear: Tympanic membrane, ear canal and external ear normal. There is no impacted cerumen.     Nose: Congestion and rhinorrhea present.     Mouth/Throat:     Mouth: Mucous membranes are moist.     Pharynx: Oropharynx is clear. Posterior oropharyngeal erythema present.  Cardiovascular:     Rate and Rhythm: Normal rate and regular  rhythm.     Pulses: Normal pulses.     Heart sounds: Normal heart sounds. No murmur heard.   No gallop.  Pulmonary:     Effort: Pulmonary effort is normal.     Breath sounds: Normal breath sounds. No wheezing, rhonchi or rales.  Musculoskeletal:     Cervical back: Normal range of motion and neck supple.  Lymphadenopathy:     Cervical: No cervical adenopathy.  Skin:    General: Skin is warm and dry.     Capillary Refill: Capillary refill takes less than 2 seconds.     Findings: No erythema or rash.  Neurological:     General: No focal deficit present.     Mental Status: She is alert and oriented to person, place, and time.  Psychiatric:        Mood and Affect: Mood normal.        Behavior: Behavior normal.        Thought Content: Thought content normal.        Judgment: Judgment normal.     UC Treatments / Results  Labs (all labs ordered are listed, but only abnormal results are displayed) Labs Reviewed  GROUP A STREP BY PCR    EKG   Radiology No results found.  Procedures Procedures (including critical care time)  Medications Ordered in UC Medications - No data to display  Initial Impression / Assessment and Plan / UC Course  I have reviewed the triage vital signs and the nursing notes.  Pertinent labs & imaging results that were available during my care of the patient were reviewed by me and considered in my medical decision making (see chart for details).  Patient is a very pleasant and nontoxic-appearing 25 year old female here for evaluation of upper respiratory complaints as outlined in HPI above.  Patient's physical exam reveals pearly gray tympanic membranes bilaterally with a normal light reflex and clear external auditory canals.  Nasal mucosa is mildly erythematous and edematous with clear nasal discharge.  Oropharyngeal exam reveals posterior oropharyngeal erythema with clear postnasal drip.  Tonsils are surgically absent.  There  is no cervical  lymphadenopathy appreciated exam.  Cardiopulmonary exam is benign.  Strep PCR collected at triage.  Strep PCR is negative.  Will discharge patient home with diagnosis of nasopharyngitis.  Will give Atrovent nasal spray to help patient with nasal congestion and postnasal drip as she is currently breast-feeding.   Final Clinical Impressions(s) / UC Diagnoses   Final diagnoses:  Nasopharyngitis acute     Discharge Instructions      Use the Atrovent nasal spray, 2 squirts in each nostril every 6 hours, as needed for runny nose and postnasal drip.  Return for reevaluation or see your primary care provider for any new or worsening symptoms.       ED Prescriptions     Medication Sig Dispense Auth. Provider   ipratropium (ATROVENT) 0.06 % nasal spray Place 2 sprays into both nostrils 4 (four) times daily. 15 mL Becky Augusta, NP      PDMP not reviewed this encounter.   Becky Augusta, NP 06/03/21 1215

## 2021-06-10 ENCOUNTER — Encounter: Payer: Medicaid Other | Admitting: Obstetrics and Gynecology

## 2021-06-16 ENCOUNTER — Encounter: Payer: Medicaid Other | Admitting: Obstetrics and Gynecology

## 2021-06-18 ENCOUNTER — Other Ambulatory Visit: Payer: Self-pay

## 2021-06-18 ENCOUNTER — Ambulatory Visit (INDEPENDENT_AMBULATORY_CARE_PROVIDER_SITE_OTHER): Payer: Medicaid Other | Admitting: Obstetrics and Gynecology

## 2021-06-18 ENCOUNTER — Encounter: Payer: Self-pay | Admitting: Obstetrics and Gynecology

## 2021-06-18 NOTE — Progress Notes (Signed)
HPI:      [name] is a 25 y.o. G1P1001 who LMP was Patient's last menstrual period was 06/08/2021 (approximate).  Subjective:   She presents today 6 weeks postpartum.  She reports she is doing well.  She stopped bleeding after delivery and last when she had her menses.  This has now stopped. She is now bottlefeeding full-time.   Daughter has now come home from the hospital and is doing well.  Emily Bullock has found an in-home daycare for her and is looking forward to return to work. She states that she is not interested in birth control at this time.    Hx: The following portions of the patient's history were reviewed and updated as appropriate:             She  has a past medical history of Medical history non-contributory. She does not have any pertinent problems on file. She  has a past surgical history that includes Tonsillectomy and Wisdom tooth extraction. Her family history includes Breast cancer in her mother; Hypertension in her mother. She  reports that she has never smoked. She has never used smokeless tobacco. She reports previous alcohol use. She reports previous drug use. Drug: Marijuana. She has a current medication list which includes the following prescription(s): ipratropium. She is allergic to red dye.       Review of Systems:  Review of Systems  Constitutional: Denied constitutional symptoms, night sweats, recent illness, fatigue, fever, insomnia and weight loss.  Eyes: Denied eye symptoms, eye pain, photophobia, vision change and visual disturbance.  Ears/Nose/Throat/Neck: Denied ear, nose, throat or neck symptoms, hearing loss, nasal discharge, sinus congestion and sore throat.  Cardiovascular: Denied cardiovascular symptoms, arrhythmia, chest pain/pressure, edema, exercise intolerance, orthopnea and palpitations.  Respiratory: Denied pulmonary symptoms, asthma, pleuritic pain, productive sputum, cough, dyspnea and wheezing.  Gastrointestinal: Denied,  gastro-esophageal reflux, melena, nausea and vomiting.  Genitourinary: Denied genitourinary symptoms including symptomatic vaginal discharge, pelvic relaxation issues, and urinary complaints.  Musculoskeletal: Denied musculoskeletal symptoms, stiffness, swelling, muscle weakness and myalgia.  Dermatologic: Denied dermatology symptoms, rash and scar.  Neurologic: Denied neurology symptoms, dizziness, headache, neck pain and syncope.  Psychiatric: Denied psychiatric symptoms, anxiety and depression.  Endocrine: Denied endocrine symptoms including hot flashes and night sweats.   Meds:   Current Outpatient Medications on File Prior to Visit  Medication Sig Dispense Refill   ipratropium (ATROVENT) 0.06 % nasal spray Place 2 sprays into both nostrils 4 (four) times daily. 15 mL 12   No current facility-administered medications on file prior to visit.      Objective:     Vitals:   06/18/21 1542  BP: 117/80  Pulse: 73   Filed Weights   06/18/21 1542  Weight: 190 lb 1.6 oz (86.2 kg)                Assessment:    G1P1001 Patient Active Problem List   Diagnosis Date Noted   Postpartum hypertension 05/06/2021   Intrauterine normal pregnancy 05/02/2021   Term pregnancy 04/30/2021   Indication for care in labor or delivery 04/30/2021   Normal labor 04/30/2021   Irregular uterine contractions    Labor and delivery indication for care or intervention 02/05/2021     1. Postpartum care and examination immediately after delivery     Patient doing well.  Would like to return to work  Baby at home doing well -has daycare lined up   Plan:  1.  Patient may resume normal activities  2.  Return to work  3.  Follow-up 3 months for annual examination and discussion regarding possible birth control. Orders No orders of the defined types were placed in this encounter.   No orders of the defined types were placed in this encounter.     F/U  Return in about 3 months  (around 09/18/2021) for Annual Physical.  Elonda Husky, M.D. 06/18/2021 3:55 PM

## 2021-09-22 ENCOUNTER — Encounter: Payer: Medicaid Other | Admitting: Obstetrics and Gynecology

## 2021-10-06 DIAGNOSIS — E669 Obesity, unspecified: Secondary | ICD-10-CM | POA: Diagnosis not present

## 2021-10-06 DIAGNOSIS — Z3009 Encounter for other general counseling and advice on contraception: Secondary | ICD-10-CM | POA: Diagnosis not present

## 2021-10-06 DIAGNOSIS — Z7185 Encounter for immunization safety counseling: Secondary | ICD-10-CM | POA: Diagnosis not present

## 2021-11-25 DIAGNOSIS — R0989 Other specified symptoms and signs involving the circulatory and respiratory systems: Secondary | ICD-10-CM | POA: Diagnosis not present

## 2021-11-25 DIAGNOSIS — H6691 Otitis media, unspecified, right ear: Secondary | ICD-10-CM | POA: Diagnosis not present

## 2021-11-25 DIAGNOSIS — R0981 Nasal congestion: Secondary | ICD-10-CM | POA: Diagnosis not present

## 2021-11-25 DIAGNOSIS — R519 Headache, unspecified: Secondary | ICD-10-CM | POA: Diagnosis not present

## 2022-01-07 DIAGNOSIS — E669 Obesity, unspecified: Secondary | ICD-10-CM | POA: Diagnosis not present

## 2022-01-07 DIAGNOSIS — F419 Anxiety disorder, unspecified: Secondary | ICD-10-CM | POA: Diagnosis not present

## 2022-03-10 DIAGNOSIS — F411 Generalized anxiety disorder: Secondary | ICD-10-CM | POA: Diagnosis not present

## 2022-03-23 DIAGNOSIS — F411 Generalized anxiety disorder: Secondary | ICD-10-CM | POA: Diagnosis not present

## 2022-06-08 ENCOUNTER — Other Ambulatory Visit: Payer: Self-pay

## 2022-06-08 ENCOUNTER — Ambulatory Visit
Admission: EM | Admit: 2022-06-08 | Discharge: 2022-06-08 | Disposition: A | Discharge status: Home / Self Care | Payer: Medicaid Other

## 2022-06-08 DIAGNOSIS — S29011A Strain of muscle and tendon of front wall of thorax, initial encounter: Secondary | ICD-10-CM

## 2022-06-08 MED ORDER — IBUPROFEN 600 MG PO TABS: 600.0000 mg | ORAL_TABLET | Freq: Four times a day (QID) | ORAL | 0 refills | Status: AC | PRN

## 2022-06-08 MED ORDER — BACLOFEN 10 MG PO TABS: 10.0000 mg | ORAL_TABLET | Freq: Three times a day (TID) | ORAL | 0 refills | Status: AC

## 2022-06-08 NOTE — ED Provider Notes (Signed)
[facility]    CSN: [id number] Arrival date & time: 06/08/22  1758      History   Chief Complaint Chief Complaint  Patient presents with   Chest Pain    HPI Emily Bullock is a 26 y.o. female.   HPI  26 year old female here for evaluation of right-sided chest pain.  The patient reports that she was moving furniture and rugs around when she felt a pull in her right anterior chest wall.  She states that since that time she has been feeling a knot in the area where she felt a pull and having pain with deep breathing and with use of her right arm.  She did take Tylenol but it did not help her pain.  She has not taken ibuprofen.  She denies any shortness of breath.  Past Medical History:  Diagnosis Date   Medical history non-contributory     Patient Active Problem List   Diagnosis Date Noted   Postpartum hypertension 05/06/2021   Intrauterine normal pregnancy 05/02/2021   Term pregnancy 04/30/2021   Indication for care in labor or delivery 04/30/2021   Normal labor 04/30/2021   Irregular uterine contractions    Labor and delivery indication for care or intervention 02/05/2021    Past Surgical History:  Procedure Laterality Date   TONSILLECTOMY     WISDOM TOOTH EXTRACTION      OB History     Gravida  1   Para  1   Term  1   Preterm      AB      Living  1      SAB      IAB      Ectopic      Multiple  0   Live Births  1            Home Medications    Prior to Admission medications   Medication Sig Start Date End Date Taking? Authorizing Provider  baclofen (LIORESAL) 10 MG tablet Take 1 tablet (10 mg total) by mouth 3 (three) times daily. 06/08/22  Yes Becky Augusta, NP  ibuprofen (ADVIL) 600 MG tablet Take 1 tablet (600 mg total) by mouth every 6 (six) hours as needed. 06/08/22  Yes Becky Augusta, NP  phentermine 37.5 MG capsule Take 37.5 mg by mouth every morning.   Yes [provider]  ipratropium (ATROVENT) 0.06 %  nasal spray Place 2 sprays into both nostrils 4 (four) times daily. 06/03/21   Becky Augusta, NP    Family History Family History  Problem Relation Age of Onset   Hypertension Mother    Breast cancer Mother     Social History Social History   Tobacco Use   Smoking status: Never   Smokeless tobacco: Never  Vaping Use   Vaping Use: Former  Substance Use Topics   Alcohol use: Not Currently   Drug use: Not Currently    Types: Marijuana     Allergies   Red dye   Review of Systems Review of Systems  Constitutional:  Negative for fever.  Cardiovascular:  Positive for chest pain.  Musculoskeletal:  Positive for myalgias.  Neurological:  Negative for weakness and numbness.  Hematological: Negative.   Psychiatric/Behavioral: Negative.       Physical Exam Triage Vital Signs ED Triage Vitals  Enc Vitals Group     BP 06/08/22 1816 125/90     Pulse Rate 06/08/22 1816 80     Resp 06/08/22 1816 15  Temp 06/08/22 1816 98.6 F (37 C)     Temp Source 06/08/22 1816 Oral     SpO2 06/08/22 1816 100 %     Weight 06/08/22 1819 190 lb (86.2 kg)     Height 06/08/22 1819 5\' 2"  (1.575 m)     Head Circumference --      Peak Flow --      Pain Score --      Pain Loc --      Pain Edu? --      Excl. in GC? --    No data found.  Updated Vital Signs BP 125/90 (BP Location: Left Arm)   Pulse 80   Temp 98.6 F (37 C) (Oral)   Resp 15   Ht 5\' 2"  (1.575 m)   Wt 190 lb (86.2 kg)   LMP 05/11/2022 (Exact Date)   SpO2 100%   BMI 34.75 kg/m   Visual Acuity Right Eye Distance:   Left Eye Distance:   Bilateral Distance:    Right Eye Near:   Left Eye Near:    Bilateral Near:     Physical Exam Vitals and nursing note reviewed.  Constitutional:      Appearance: Normal appearance. She is not ill-appearing.  HENT:     Head: Normocephalic and atraumatic.  Cardiovascular:     Rate and Rhythm: Normal rate and regular rhythm.     Pulses: Normal pulses.     Heart sounds: Normal  heart sounds. No murmur heard.    No friction rub. No gallop.  Pulmonary:     Effort: Pulmonary effort is normal.     Breath sounds: Normal breath sounds. No wheezing, rhonchi or rales.  Chest:     Chest wall: Tenderness present.  Skin:    General: Skin is warm and dry.     Capillary Refill: Capillary refill takes less than 2 seconds.  Neurological:     General: No focal deficit present.     Mental Status: She is alert and oriented to person, place, and time.  Psychiatric:        Mood and Affect: Mood normal.        Behavior: Behavior normal.        Thought Content: Thought content normal.        Judgment: Judgment normal.      UC Treatments / Results  Labs (all labs ordered are listed, but only abnormal results are displayed) Labs Reviewed - No data to display  EKG   Radiology No results found.  Procedures Procedures (including critical care time)  Medications Ordered in UC Medications - No data to display  Initial Impression / Assessment and Plan / UC Course  I have reviewed the triage vital signs and the nursing notes.  Pertinent labs & imaging results that were available during my care of the patient were reviewed by me and considered in my medical decision making (see chart for details).  Patient is a very pleasant, nontoxic-appearing 26 year old female here for evaluation of right-sided chest wall pain that started this afternoon after lifting a heavy box.  She states that she felt a pull in the anterior aspect of her right chest and has been having pain with deep breathing and with use of her right arm since then.  No shortness of breath.  The pain is reproducible to palpation.  The patient reports that she feels a knot in her anterior chest.  On exam patient has a benign cardiopulmonary exam with S1-S2 heart  sounds with regular rate and rhythm and lung sounds that are clear to auscultation all fields.  Patient does have tenderness to palpation in the muscle group  of the right anterior chest wall and there is an area of tension in the anterior lateral aspect of her right pectoral muscle.  I suspect that she has strained her pectoral muscle.  I will treat her with ibuprofen, baclofen, and rest.  I have also advised her to apply moist heat to her chest wall to help improve blood flow and aid in muscle healing.  Work note provided   Final Clinical Impressions(s) / UC Diagnoses   Final diagnoses:  Pectoralis muscle strain, initial encounter     Discharge Instructions      Take the ibuprofen, 600 mg every 6 hours with food, on a schedule for the next 48 hours and then as needed.  Take the baclofen, 10 mg every 8 hours, on a schedule for the next 48 hours and then as needed.  Apply moist heat to your chest for 30 minutes at a time 2-3 times a day to improve blood flow to the area and help remove the lactic acid causing the spasm.  Rest as much as possible..  Return for reevaluation for any new or worsening symptoms.      ED Prescriptions     Medication Sig Dispense Auth. Provider   ibuprofen (ADVIL) 600 MG tablet Take 1 tablet (600 mg total) by mouth every 6 (six) hours as needed. 30 tablet Becky Augusta, NP   baclofen (LIORESAL) 10 MG tablet Take 1 tablet (10 mg total) by mouth 3 (three) times daily. 30 each Becky Augusta, NP      PDMP not reviewed this encounter.   Becky Augusta, NP 06/08/22 [id number]

## 2022-06-08 NOTE — Discharge Instructions (Signed)
Take the ibuprofen, 600 mg every 6 hours with food, on a schedule for the next 48 hours and then as needed.  Take the baclofen, 10 mg every 8 hours, on a schedule for the next 48 hours and then as needed.  Apply moist heat to your chest for 30 minutes at a time 2-3 times a day to improve blood flow to the area and help remove the lactic acid causing the spasm.  Rest as much as possible..  Return for reevaluation for any new or worsening symptoms.

## 2022-06-08 NOTE — ED Triage Notes (Signed)
Pt stated she was moving some heavy stuff around this afternoon and she thinks she might have pulled something in her right upper chest. Between her shoulder and chest she states she feels a knot. She also states that when she breathe in or lifts her arm it exacerbates the pain. This happened around noon. She stated she did take some Tylenol which did not alleviate the symptoms.

## 2022-06-26 ENCOUNTER — Emergency Department
Admission: EM | Admit: 2022-06-26 | Discharge: 2022-06-26 | Disposition: A | Discharge status: Home / Self Care | Payer: Medicaid Other | Attending: Emergency Medicine | Admitting: Emergency Medicine

## 2022-06-26 ENCOUNTER — Other Ambulatory Visit: Payer: Self-pay

## 2022-06-26 ENCOUNTER — Emergency Department: Payer: Medicaid Other

## 2022-06-26 DIAGNOSIS — R0602 Shortness of breath: Secondary | ICD-10-CM | POA: Insufficient documentation

## 2022-06-26 DIAGNOSIS — R079 Chest pain, unspecified: Secondary | ICD-10-CM

## 2022-06-26 DIAGNOSIS — F419 Anxiety disorder, unspecified: Secondary | ICD-10-CM

## 2022-06-26 DIAGNOSIS — M79605 Pain in left leg: Secondary | ICD-10-CM | POA: Insufficient documentation

## 2022-06-26 LAB — URINALYSIS, ROUTINE W REFLEX MICROSCOPIC
Bilirubin Urine: NEGATIVE
Glucose, UA: NEGATIVE mg/dL
Hgb urine dipstick: NEGATIVE
Ketones, ur: NEGATIVE mg/dL
Leukocytes,Ua: NEGATIVE
Nitrite: NEGATIVE
Protein, ur: NEGATIVE mg/dL
Specific Gravity, Urine: 1.019 (ref 1.005–1.030)
Squamous Epithelial / HPF: >50 — ABNORMAL HIGH (ref 0–5)
pH: 6 (ref 5.0–8.0)

## 2022-06-26 LAB — POC URINE PREG, ED: Preg Test, Ur: NEGATIVE

## 2022-06-26 LAB — CBC
HCT: 39.2 % (ref 36.0–46.0)
Hemoglobin: 12.5 g/dL (ref 12.0–15.0)
MCH: 28.9 pg (ref 26.0–34.0)
MCHC: 31.9 g/dL (ref 30.0–36.0)
MCV: 90.7 fL (ref 80.0–100.0)
Platelets: 382 10*3/uL (ref 150–400)
RBC: 4.32 MIL/uL (ref 3.87–5.11)
RDW: 12.7 % (ref 11.5–15.5)
WBC: 5.5 10*3/uL (ref 4.0–10.5)
nRBC: 0 % (ref 0.0–0.2)

## 2022-06-26 LAB — D-DIMER, QUANTITATIVE: D-Dimer, Quant: 0.36 ug/mL-FEU (ref 0.00–0.50)

## 2022-06-26 LAB — TROPONIN I (HIGH SENSITIVITY)
Troponin I (High Sensitivity): <2 ng/L (ref <18)
Troponin I (High Sensitivity): <2 ng/L (ref <18)

## 2022-06-26 LAB — BASIC METABOLIC PANEL
Anion gap: 7 (ref 5–15)
BUN: 10 mg/dL (ref 6–20)
CO2: 22 mmol/L (ref 22–32)
Calcium: 9.5 mg/dL (ref 8.9–10.3)
Chloride: 109 mmol/L (ref 98–111)
Creatinine, Ser: 0.88 mg/dL (ref 0.44–1.00)
GFR, Estimated: >60 mL/min (ref >60)
Glucose, Bld: 95 mg/dL (ref 70–99)
Potassium: 3.9 mmol/L (ref 3.5–5.1)
Sodium: 138 mmol/L (ref 135–145)

## 2022-06-26 LAB — HEPATIC FUNCTION PANEL
ALT: 12 U/L (ref 0–44)
AST: 16 U/L (ref 15–41)
Albumin: 4.5 g/dL (ref 3.5–5.0)
Alkaline Phosphatase: 79 U/L (ref 38–126)
Bilirubin, Direct: <0.1 mg/dL (ref 0.0–0.2)
Total Bilirubin: 0.5 mg/dL (ref 0.3–1.2)
Total Protein: 8 g/dL (ref 6.5–8.1)

## 2022-06-26 LAB — CK: Total CK: 170 U/L (ref 38–234)

## 2022-06-26 MED ORDER — LIDOCAINE 5 % EX PTCH
1.0000 | MEDICATED_PATCH | CUTANEOUS | Status: DC
  Administered 2022-06-26: 1 via TRANSDERMAL
  Filled 2022-06-26: qty 1

## 2022-06-26 MED ORDER — KETOROLAC TROMETHAMINE 15 MG/ML IJ SOLN
15.0000 mg | Freq: Once | INTRAMUSCULAR | Status: AC
  Administered 2022-06-26: 15 mg via INTRAVENOUS
  Filled 2022-06-26: qty 1

## 2022-06-26 MED ORDER — SODIUM CHLORIDE 0.9 % IV BOLUS
1000.0000 mL | Freq: Once | INTRAVENOUS | Status: AC
  Administered 2022-06-26: 1000 mL via INTRAVENOUS

## 2022-06-26 MED ORDER — LIDOCAINE 5 % EX PTCH: 1.0000 | MEDICATED_PATCH | Freq: Two times a day (BID) | CUTANEOUS | 0 refills | Status: DC

## 2022-06-26 MED ORDER — LIDOCAINE 5 % EX PTCH: 1.0000 | MEDICATED_PATCH | Freq: Two times a day (BID) | CUTANEOUS | 0 refills | Status: AC

## 2022-06-26 NOTE — ED Triage Notes (Signed)
Ambulatory to triage, c/o chest pain and hypertension. Pt states she thinks it is anxiety. Chest pain on right side, reproducible on palpation.

## 2022-06-26 NOTE — ED Notes (Addendum)
26 yof with a c/c of chest pain on the right side. The pt does have some chest tenderness upon palpation. The pt is warm, pink, and dry. Alert and oriented x 4.

## 2022-06-26 NOTE — ED Provider Notes (Signed)
[facility] Provider Note    Event Date/Time   First MD Initiated Contact with Patient 06/26/22 1447     (approximate)   History   Chest Pain   HPI  Emily Bullock is a 26 y.o. female with history of some anxiety who comes in with concern for chest pain.  Patient reports taking the ibuprofen and baclofen for chest pain but today she noted some pain in her left leg and some worsening of the pain.  She does report a little bit of shortness of breath associated with it.  She is not sure if this is all just related to her and her anxiety.  She reports that she is supposed to be started on some medication but did not take it.  She denies any coughing, fevers risk factors for PE.  She states the pain in her left leg is related to working out more recently.   Review of records patient was seen on 06/08/2022 and was diagnosed with a pectoral muscular strain.  Patient was treated with ibuprofen, baclofen and rest.   Physical Exam   Triage Vital Signs: ED Triage Vitals [06/26/22 1404]  Enc Vitals Group     BP 135/86     Pulse Rate (!) 106     Resp 18     Temp 98.3 F (36.8 C)     Temp Source Oral     SpO2 100 %     Weight 190 lb (86.2 kg)     Height 5\' 2"  (1.575 m)     Head Circumference      Peak Flow      Pain Score 6     Pain Loc      Pain Edu?      Excl. in GC?     Most recent vital signs: Vitals:   06/26/22 1404 06/26/22 1405  BP: 135/86   Pulse: (!) 106   Resp: 18   Temp: 98.3 F (36.8 C) 98.8 F (37.1 C)  SpO2: 100%      General: Awake, no distress.  CV:  Good peripheral perfusion.  Reproducible chest wall pain without any evidence of any rash Resp:  Normal effort.  Abd:  No distention.  Other:  No obvious swelling or calf tenderness although patient does report some pain in her left leg.  She is got good distal pulse.   ED Results / Procedures / Treatments   Labs (all labs ordered are listed, but only abnormal results are  displayed) Labs Reviewed  URINALYSIS, ROUTINE W REFLEX MICROSCOPIC - Abnormal; Notable for the following components:      Result Value   Color, Urine YELLOW (*)    APPearance TURBID (*)    Bacteria, UA MANY (*)    Squamous Epithelial / LPF >50 (*)    All other components within normal limits  BASIC METABOLIC PANEL  CBC  POC URINE PREG, ED  TROPONIN I (HIGH SENSITIVITY)     EKG  My interpretation of EKG:  Normal sinus rhythm 92 without any ST elevation or T wave inversion except for lead III, normal intervals  RADIOLOGY I have reviewed the xray personally and interpreted no evidence of any pneumonia  PROCEDURES:  Critical Care performed: No  Procedures   MEDICATIONS ORDERED IN ED: Medications - No data to display   IMPRESSION / MDM / ASSESSMENT AND PLAN / ED COURSE  I reviewed the triage vital signs and the nursing notes.   Patient's presentation is  most consistent with acute presentation with potential threat to life or bodily function.   Differential includes ACS, PE, DVT.  No abdominal tenderness to suggest abdominal pathology.  Unable to [facility] out due to tachycardia will get D-dimer.  Pregnancy test negative.  BMP reassuring.  CBC no anemia.  Troponin negative.  Urine with significant amount of squamous cells so unlikely good collection.  D-dimer negative.  Troponins negative x2.  CK normal.  Hepatic function reassuring  Reevaluated patient and she reports feeling better.  Considered admission but given negative D-dimer and cardiac markers x2 and her chest pain being reproducible on examination I suspect there is a component of musculoskeletal chest pain with anxiety.  Patient's mom is at bedside and they feel comfortable with discharge home and return if symptoms are worsening or they develop any other concerns  There was an oxygen charted of 86% I discussed with the nurse tech who validated and it was just popped up in the vital signs but was not actually done by  the tech herself.  I went into the room and the oxygen probe was not even on her finger.  When I rechecked it myself it was 100%.  Do not feel that this is accurate in the nurse tech did go in and also double checked the vital signs and they were normal    FINAL CLINICAL IMPRESSION(S) / ED DIAGNOSES   Final diagnoses:  Chest pain, unspecified type  Anxiety     Rx / DC Orders   ED Discharge Orders          Ordered    lidocaine (LIDODERM) 5 %  Every 12 hours        06/26/22 1754             Note:  This document was prepared using Dragon voice recognition software and may include unintentional dictation errors.   Concha Se, MD 06/26/22 1755

## 2022-06-26 NOTE — Discharge Instructions (Addendum)
Continue the ibuprofen and muscle relaxers to help with the pain.  Prescribed some lidocaine patches to also help.  Discussed with your primary care doctor your anxiety and further treatment for this.  Return to the ER if develop worsening symptoms or any other concerns

## 2022-09-13 ENCOUNTER — Encounter: Payer: Self-pay | Admitting: Emergency Medicine

## 2022-09-13 ENCOUNTER — Ambulatory Visit
Admission: EM | Admit: 2022-09-13 | Discharge: 2022-09-13 | Disposition: A | Discharge status: Home / Self Care | Payer: Medicaid Other | Attending: Emergency Medicine | Admitting: Emergency Medicine

## 2022-09-13 ENCOUNTER — Ambulatory Visit (INDEPENDENT_AMBULATORY_CARE_PROVIDER_SITE_OTHER)
Admit: 2022-09-13 | Discharge: 2022-09-13 | Disposition: A | Discharge status: Home / Self Care | Payer: Medicaid Other | Attending: Emergency Medicine | Admitting: Emergency Medicine

## 2022-09-13 DIAGNOSIS — Z1152 Encounter for screening for COVID-19: Secondary | ICD-10-CM | POA: Insufficient documentation

## 2022-09-13 DIAGNOSIS — R112 Nausea with vomiting, unspecified: Secondary | ICD-10-CM

## 2022-09-13 DIAGNOSIS — B3731 Acute candidiasis of vulva and vagina: Secondary | ICD-10-CM | POA: Insufficient documentation

## 2022-09-13 DIAGNOSIS — R1011 Right upper quadrant pain: Secondary | ICD-10-CM

## 2022-09-13 DIAGNOSIS — E860 Dehydration: Secondary | ICD-10-CM | POA: Insufficient documentation

## 2022-09-13 DIAGNOSIS — K529 Noninfective gastroenteritis and colitis, unspecified: Secondary | ICD-10-CM | POA: Insufficient documentation

## 2022-09-13 DIAGNOSIS — R1010 Upper abdominal pain, unspecified: Secondary | ICD-10-CM

## 2022-09-13 LAB — URINALYSIS, MICROSCOPIC (REFLEX)

## 2022-09-13 LAB — COMPREHENSIVE METABOLIC PANEL
ALT: 15 U/L (ref 0–44)
AST: 21 U/L (ref 15–41)
Albumin: 4.2 g/dL (ref 3.5–5.0)
Alkaline Phosphatase: 75 U/L (ref 38–126)
Anion gap: 7 (ref 5–15)
BUN: 9 mg/dL (ref 6–20)
CO2: 24 mmol/L (ref 22–32)
Calcium: 8.7 mg/dL — ABNORMAL LOW (ref 8.9–10.3)
Chloride: 107 mmol/L (ref 98–111)
Creatinine, Ser: 0.73 mg/dL (ref 0.44–1.00)
GFR, Estimated: >60 mL/min (ref >60)
Glucose, Bld: 107 mg/dL — ABNORMAL HIGH (ref 70–99)
Potassium: 3.9 mmol/L (ref 3.5–5.1)
Sodium: 138 mmol/L (ref 135–145)
Total Bilirubin: 0.4 mg/dL (ref 0.3–1.2)
Total Protein: 8 g/dL (ref 6.5–8.1)

## 2022-09-13 LAB — RESP PANEL BY RT-PCR (FLU A&B, COVID) ARPGX2
Influenza A by PCR: NEGATIVE
Influenza B by PCR: NEGATIVE
SARS Coronavirus 2 by RT PCR: NEGATIVE

## 2022-09-13 LAB — CBC WITH DIFFERENTIAL/PLATELET
Abs Immature Granulocytes: 0.05 10*3/uL (ref 0.00–0.07)
Basophils Absolute: 0 10*3/uL (ref 0.0–0.1)
Basophils Relative: 0 %
Eosinophils Absolute: 0 10*3/uL (ref 0.0–0.5)
Eosinophils Relative: 0 %
HCT: 39.7 % (ref 36.0–46.0)
Hemoglobin: 13 g/dL (ref 12.0–15.0)
Immature Granulocytes: 1 %
Lymphocytes Relative: 8 %
Lymphs Abs: 0.6 10*3/uL — ABNORMAL LOW (ref 0.7–4.0)
MCH: 30.1 pg (ref 26.0–34.0)
MCHC: 32.7 g/dL (ref 30.0–36.0)
MCV: 91.9 fL (ref 80.0–100.0)
Monocytes Absolute: 0.3 10*3/uL (ref 0.1–1.0)
Monocytes Relative: 4 %
Neutro Abs: 6.1 10*3/uL (ref 1.7–7.7)
Neutrophils Relative %: 87 %
Platelets: 388 10*3/uL (ref 150–400)
RBC: 4.32 MIL/uL (ref 3.87–5.11)
RDW: 13.2 % (ref 11.5–15.5)
WBC: 7.1 10*3/uL (ref 4.0–10.5)
nRBC: 0 % (ref 0.0–0.2)

## 2022-09-13 LAB — URINALYSIS, ROUTINE W REFLEX MICROSCOPIC
Glucose, UA: NEGATIVE mg/dL
Ketones, ur: 15 mg/dL — AB
Leukocytes,Ua: NEGATIVE
Nitrite: NEGATIVE
Protein, ur: 30 mg/dL — AB
Specific Gravity, Urine: 1.02 (ref 1.005–1.030)
pH: 7 (ref 5.0–8.0)

## 2022-09-13 LAB — PREGNANCY, URINE: Preg Test, Ur: NEGATIVE

## 2022-09-13 LAB — LIPASE, BLOOD: Lipase: 26 U/L (ref 11–51)

## 2022-09-13 MED ORDER — FLUCONAZOLE 150 MG PO TABS: 150.0000 mg | ORAL_TABLET | Freq: Once | ORAL | 1 refills | Status: AC

## 2022-09-13 MED ORDER — ONDANSETRON 8 MG PO TBDP: ORAL_TABLET | ORAL | 0 refills | Status: AC

## 2022-09-13 MED ORDER — ONDANSETRON 8 MG PO TBDP
8.0000 mg | ORAL_TABLET | Freq: Once | ORAL | Status: AC
Start: 2022-09-13 — End: 2022-09-13
  Administered 2022-09-13: 8 mg via ORAL

## 2022-09-13 MED ORDER — SODIUM CHLORIDE 0.9 % IV BOLUS
1000.0000 mL | Freq: Once | INTRAVENOUS | Status: AC
  Administered 2022-09-13: 1000 mL via INTRAVENOUS

## 2022-09-13 MED ORDER — KETOROLAC TROMETHAMINE 60 MG/2ML IM SOLN
30.0000 mg | Freq: Once | INTRAMUSCULAR | Status: AC
  Administered 2022-09-13: 30 mg via INTRAMUSCULAR

## 2022-09-13 MED ORDER — KETOROLAC TROMETHAMINE 60 MG/2ML IM SOLN: 30.0000 mg | Freq: Once | INTRAMUSCULAR | Status: DC

## 2022-09-13 NOTE — ED Triage Notes (Signed)
Pt presents with vomiting, HA, diarrhea since yesterday.

## 2022-09-13 NOTE — ED Provider Notes (Incomplete)
HPI  SUBJECTIVE:  Emily Bullock is a 26 y.o. female who presents with the acute onset of body aches, headaches, nausea, over 10 episodes of nonbilious, nonbloody emesis, multiple episodes of watery, nonbloody diarrhea starting last night.  She reports intermittent, crampy right flank/upper abdominal pain that is present primarily with lying on her side.  The abdominal pain gets better after vomiting.  She reports decreased urine output.  Denies anorexia.  States that she is unable to tolerate any p.o. whatsoever.  She has tried Pepto-Bismol and ginger ale, vomited this up.  No aggravating or alleviating factors.  No antipyretic in the past 6 hours.  No fevers, nasal congestion, rhinorrhea, cough, wheeze, shortness of breath, urinary complaints, back pain.  She did recently travel to [place], but denies sick contacts, raw or undercooked foods, questionable leftovers, recent antibiotics.  No known COVID or flu exposure.  She got 2 doses of the COVID-vaccine.  She did not get this years flu vaccine.  She states the car ride over here was not painful.  Past medical history negative for diabetes, hypertension, UTI, gallbladder disease, pancreatitis, alcohol use, abdominal surgeries.  LMP: The first week in October, not sure if she could be pregnant.  PCP: [facility].   Past Medical History:  Diagnosis Date   Medical history non-contributory     Past Surgical History:  Procedure Laterality Date   TONSILLECTOMY     WISDOM TOOTH EXTRACTION      Family History  Problem Relation Age of Onset   Hypertension Mother    Breast cancer Mother     Social History   Tobacco Use   Smoking status: Never   Smokeless tobacco: Never  Vaping Use   Vaping Use: Former  Substance Use Topics   Alcohol use: Not Currently   Drug use: Not Currently    Types: Marijuana    No current facility-administered medications for this encounter.  Current Outpatient Medications:    fluconazole (DIFLUCAN) 150 MG  tablet, Take 1 tablet (150 mg total) by mouth once for 1 dose. 1 tab po x 1. May repeat in 72 hours if no improvement, Disp: 2 tablet, Rfl: 1   ondansetron (ZOFRAN-ODT) 8 MG disintegrating tablet, 1/2- 1 tablet q 8 hr prn nausea, vomiting, Disp: 20 tablet, Rfl: 0   phentermine 37.5 MG capsule, Take 37.5 mg by mouth every morning., Disp: , Rfl:    baclofen (LIORESAL) 10 MG tablet, Take 1 tablet (10 mg total) by mouth 3 (three) times daily., Disp: 30 each, Rfl: 0   ibuprofen (ADVIL) 600 MG tablet, Take 1 tablet (600 mg total) by mouth every 6 (six) hours as needed., Disp: 30 tablet, Rfl: 0   ipratropium (ATROVENT) 0.06 % nasal spray, Place 2 sprays into both nostrils 4 (four) times daily., Disp: 15 mL, Rfl: 12  Allergies  Allergen Reactions   Red Dye Anaphylaxis     ROS  As noted in HPI.   Physical Exam  BP 114/86 (BP Location: Left Arm)   Pulse (!) 106   Temp 99 F (37.2 C) (Oral)   Resp 16   LMP 09/03/2022 (Approximate)   SpO2 100%   Constitutional: Well developed, well nourished, appears ill. Eyes:  EOMI, conjunctiva normal bilaterally HENT: Normocephalic, atraumatic,mucus membranes tacky Respiratory: Normal inspiratory effort, lungs clear bilaterally Cardiovascular: Regular tachycardia, no murmurs, rubs, gallops GI: Normal appearance, soft.  Positive diffuse upper abdominal and periumbilical tenderness.  Positive Murphy.  Mild suprapubic tenderness.  Negative McBurney.  Positive tap table  test. Back: No CVAT. skin: No rash, skin intact Musculoskeletal: no deformities Neurologic: Alert & oriented x 3, no focal neuro deficits Psychiatric: Speech and behavior appropriate   ED Course   Medications  ondansetron (ZOFRAN-ODT) disintegrating tablet 8 mg (8 mg Oral Given 09/13/22 1104)  ketorolac (TORADOL) injection 30 mg (30 mg Intramuscular Given 09/13/22 1122)  sodium chloride 0.9 % bolus 1,000 mL (1,000 mLs Intravenous New Bag/Given 09/13/22 1139)    Orders Placed This  Encounter  Procedures   Resp Panel by RT-PCR (Flu A&B, Covid) Anterior Nasal Swab    Standing Status:   Standing    Number of Occurrences:   1   US Abdomen Limited RUQ (LIVER/GB)    Standing Status:   Standing    Number of Occurrences:   1    Order Specific Question:   Symptom/Reason for Exam    Answer:   RUQ pain [[id number]]   CBC with Differential    Standing Status:   Standing    Number of Occurrences:   1   Comprehensive metabolic panel    Standing Status:   Standing    Number of Occurrences:   1   Lipase, blood    Standing Status:   Standing    Number of Occurrences:   1   Pregnancy, urine    Standing Status:   Standing    Number of Occurrences:   1   Urinalysis, Routine w reflex microscopic    Standing Status:   Standing    Number of Occurrences:   1   Urinalysis, Microscopic (reflex)    Standing Status:   Standing    Number of Occurrences:   1   Recheck vitals    Standing Status:   Standing    Number of Occurrences:   1   Insert peripheral IV    Standing Status:   Standing    Number of Occurrences:   1    Results for orders placed or performed during the hospital encounter of 09/13/22 (from the past 24 hour(s))  Resp Panel by RT-PCR (Flu A&B, Covid) Anterior Nasal Swab     Status: None   Collection Time: 09/13/22 11:05 AM   Specimen: Anterior Nasal Swab  Result Value Ref Range   SARS Coronavirus 2 by RT PCR NEGATIVE NEGATIVE   Influenza A by PCR NEGATIVE NEGATIVE   Influenza B by PCR NEGATIVE NEGATIVE  Pregnancy, urine     Status: None   Collection Time: 09/13/22 11:05 AM  Result Value Ref Range   Preg Test, Ur NEGATIVE NEGATIVE  Urinalysis, Routine w reflex microscopic Anterior Nasal Swab     Status: Abnormal   Collection Time: 09/13/22 11:05 AM  Result Value Ref Range   Color, Urine YELLOW YELLOW   APPearance CLEAR CLEAR   Specific Gravity, Urine 1.020 1.005 - 1.030   pH 7.0 5.0 - 8.0   Glucose, UA NEGATIVE NEGATIVE mg/dL   Hgb urine dipstick TRACE (A)  NEGATIVE   Bilirubin Urine SMALL (A) NEGATIVE   Ketones, ur 15 (A) NEGATIVE mg/dL   Protein, ur 30 (A) NEGATIVE mg/dL   Nitrite NEGATIVE NEGATIVE   Leukocytes,Ua NEGATIVE NEGATIVE  Urinalysis, Microscopic (reflex)     Status: Abnormal   Collection Time: 09/13/22 11:05 AM  Result Value Ref Range   RBC / HPF 11-20 0 - 5 RBC/hpf   WBC, UA 0-5 0 - 5 WBC/hpf   Bacteria, UA FEW (A) NONE SEEN   Squamous Epithelial / LPF 11-20  0 - 5   Mucus PRESENT    Budding Yeast PRESENT   CBC with Differential     Status: Abnormal   Collection Time: 09/13/22 11:22 AM  Result Value Ref Range   WBC 7.1 4.0 - 10.5 K/uL   RBC 4.32 3.87 - 5.11 MIL/uL   Hemoglobin 13.0 12.0 - 15.0 g/dL   HCT 01.0 27.2 - 53.6 %   MCV 91.9 80.0 - 100.0 fL   MCH 30.1 26.0 - 34.0 pg   MCHC 32.7 30.0 - 36.0 g/dL   RDW 64.4 03.4 - 74.2 %   Platelets 388 150 - 400 K/uL   nRBC 0.0 0.0 - 0.2 %   Neutrophils Relative % 87 %   Neutro Abs 6.1 1.7 - 7.7 K/uL   Lymphocytes Relative 8 %   Lymphs Abs 0.6 (L) 0.7 - 4.0 K/uL   Monocytes Relative 4 %   Monocytes Absolute 0.3 0.1 - 1.0 K/uL   Eosinophils Relative 0 %   Eosinophils Absolute 0.0 0.0 - 0.5 K/uL   Basophils Relative 0 %   Basophils Absolute 0.0 0.0 - 0.1 K/uL   Immature Granulocytes 1 %   Abs Immature Granulocytes 0.05 0.00 - 0.07 K/uL  Comprehensive metabolic panel     Status: Abnormal   Collection Time: 09/13/22 11:22 AM  Result Value Ref Range   Sodium 138 135 - 145 mmol/L   Potassium 3.9 3.5 - 5.1 mmol/L   Chloride 107 98 - 111 mmol/L   CO2 24 22 - 32 mmol/L   Glucose, Bld 107 (H) 70 - 99 mg/dL   BUN 9 6 - 20 mg/dL   Creatinine, Ser 5.95 0.44 - 1.00 mg/dL   Calcium 8.7 (L) 8.9 - 10.3 mg/dL   Total Protein 8.0 6.5 - 8.1 g/dL   Albumin 4.2 3.5 - 5.0 g/dL   AST 21 15 - 41 U/L   ALT 15 0 - 44 U/L   Alkaline Phosphatase 75 38 - 126 U/L   Total Bilirubin 0.4 0.3 - 1.2 mg/dL   GFR, Estimated >63 >87 mL/min   Anion gap 7 5 - 15  Lipase, blood     Status: None    Collection Time: 09/13/22 11:22 AM  Result Value Ref Range   Lipase 26 11 - 51 U/L   US Abdomen Limited RUQ (LIVER/GB)  Result Date: 09/13/2022 CLINICAL DATA:  Right upper quadrant pain with nausea and vomiting x1 day EXAM: ULTRASOUND ABDOMEN LIMITED RIGHT UPPER QUADRANT COMPARISON:  None Available. FINDINGS: Gallbladder: No gallstones or wall thickening visualized. 3 mm gallbladder polyp no sonographic Murphy sign noted by sonographer. Common bile duct: Diameter: 3 cm Liver: No focal lesion identified. Within normal limits in parenchymal echogenicity. Portal vein is patent on color Doppler imaging with normal direction of blood flow towards the liver. Other: None. IMPRESSION: 1. No cholelithiasis or sonographic evidence of acute cholecystitis. 2. 3 mm gallbladder polyp. No further follow-up is required for this finding. Electronically Signed   By: Maudry Mayhew M.D.   On: 09/13/2022 12:07    ED Clinical Impression  1. Gastroenteritis   2. RUQ pain   3. Dehydration   4. Pain of upper abdomen   5. Vaginal yeast infection      ED Assessment/Plan  Concern for dehydration and cholecystitis.  Will give Zofran, Toradol, liter of IV fluids, check CBC, CMP, lipase, UA, urine pregnancy, right upper quadrant ultrasound.  Reviewed imaging report.  No cholelithiasis or evidence of cholecystitis.  See radiology  report for full details.  Lipase, CBC, CMP normal.  COVID, influenza negative.  Her urinalysis is contaminated, she has some yeast in it.  She has ketones suggestive of dehydration.  Trace hematuria noted.  her urine pregnancy is negative.  Patient states that she does have some vaginal itching, but no discharge.  Will send home with Diflucan.  She has tolerated this before in the past without any problem.  On reevaluation, patient states that she feels much better.  She is hungry and thirsty.  Vitals are improving.  Suspect a severe gastroenteritis.  Will send home with Diflucan, Zofran, push  electrolyte containing fluids.  Follow-up with PCP as needed.  ER return precautions given.   I spent 45 minutes in the care of this patient, ordering and reviewing labs, imaging reports, documenting, reevaluating the patient, discussing labs, imaging and medical decision making with the patient, and arranging for right upper quadrant ultrasound.  Discussed labs, imaging, MDM, treatment plan, and plan for follow-up with patient. Discussed sn/sx that should prompt return to the ED. patient agrees with plan.   Meds ordered this encounter  Medications   ondansetron (ZOFRAN-ODT) disintegrating tablet 8 mg   DISCONTD: ketorolac (TORADOL) injection 30 mg   ketorolac (TORADOL) injection 30 mg    Give after negative urine pregnancy test   sodium chloride 0.9 % bolus 1,000 mL   ondansetron (ZOFRAN-ODT) 8 MG disintegrating tablet    Sig: 1/2- 1 tablet q 8 hr prn nausea, vomiting    Dispense:  20 tablet    Refill:  0   fluconazole (DIFLUCAN) 150 MG tablet    Sig: Take 1 tablet (150 mg total) by mouth once for 1 dose. 1 tab po x 1. May repeat in 72 hours if no improvement    Dispense:  2 tablet    Refill:  1      *This clinic note was created using Scientist, clinical (histocompatibility and immunogenetics). Therefore, there may be occasional mistakes despite careful proofreading.  ?    Domenick Gong, MD 09/14/22 1013    Domenick Gong, MD 09/14/22 1103

## 2022-09-13 NOTE — Discharge Instructions (Addendum)
Take the Zofran as needed.  Push electrolyte containing fluids such as Pedialyte, Gatorade, liquid IV.

## 2022-11-06 IMAGING — US US OB COMP +14 WK
1 series · 13 of 28 positions shown · non-contrast
Comparison: none

CLINICAL DATA: Second trimester pregnancy for fetal anatomy survey.

EXAM:
OBSTETRICAL ULTRASOUND >14 WKS

[Series 1: us ob comp +14 wk · 0.23mm/px · 13 of 103 slices shown]
[im 4/103]
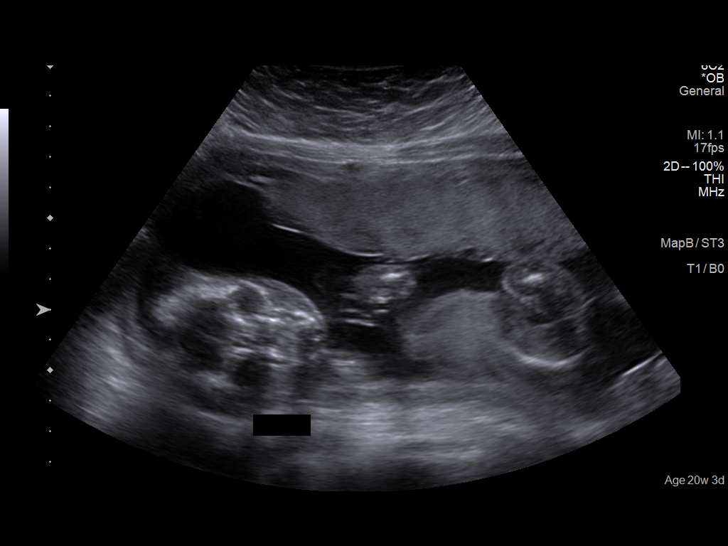
[im 12/103]
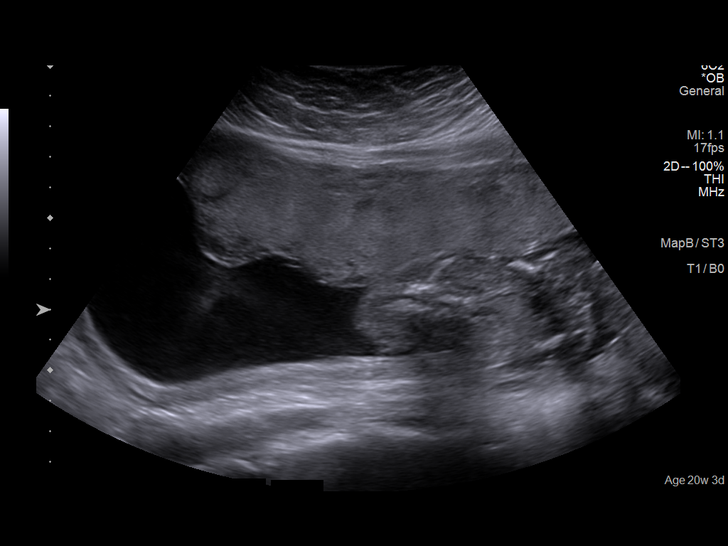
[im 19/103]
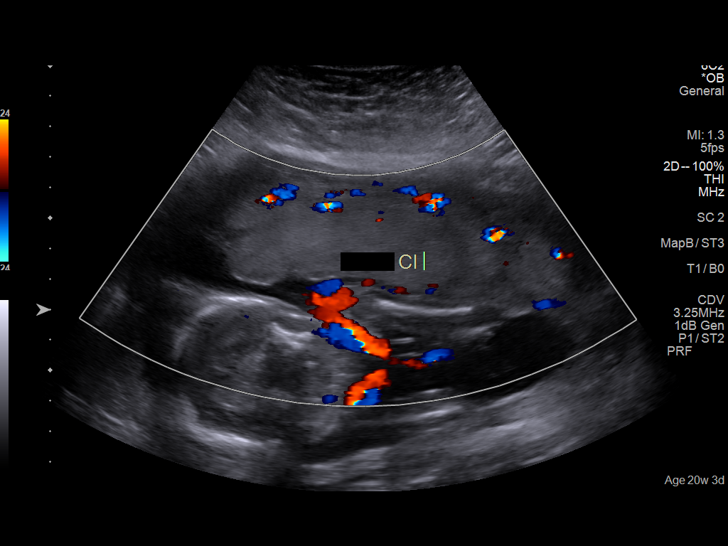
[im 27/103]
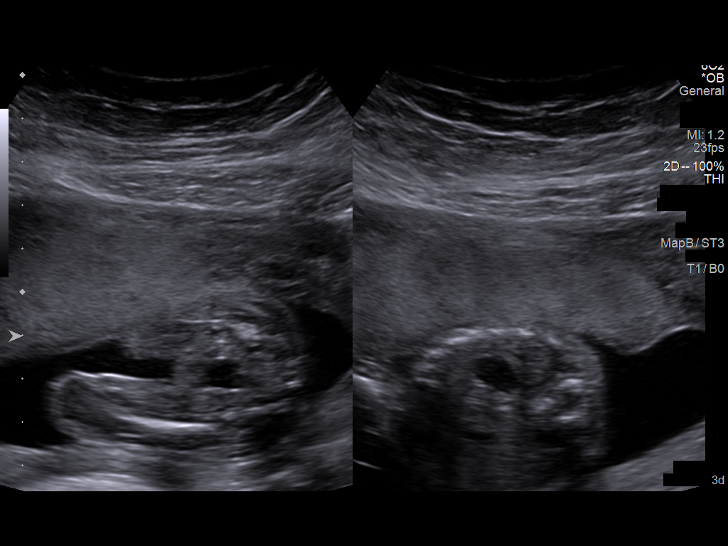
[im 35/103]
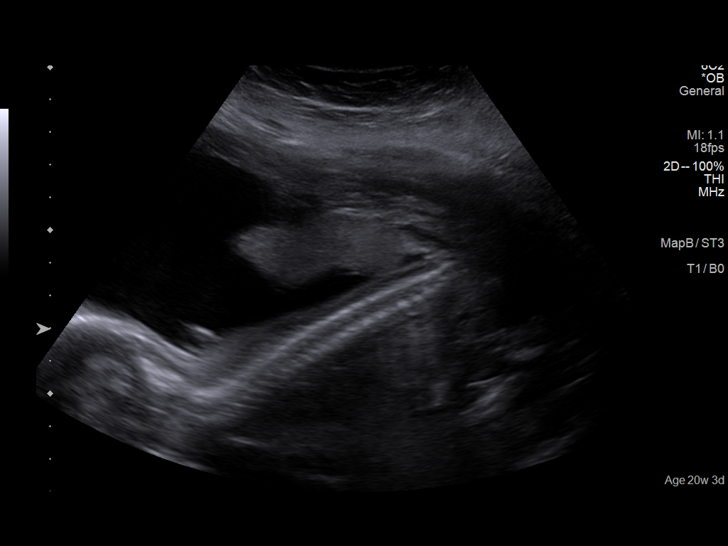
[im 42/103]
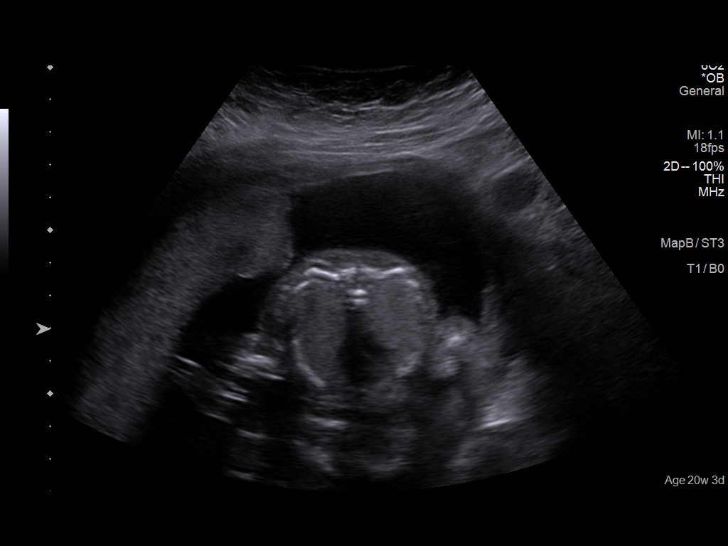
[im 53/103]
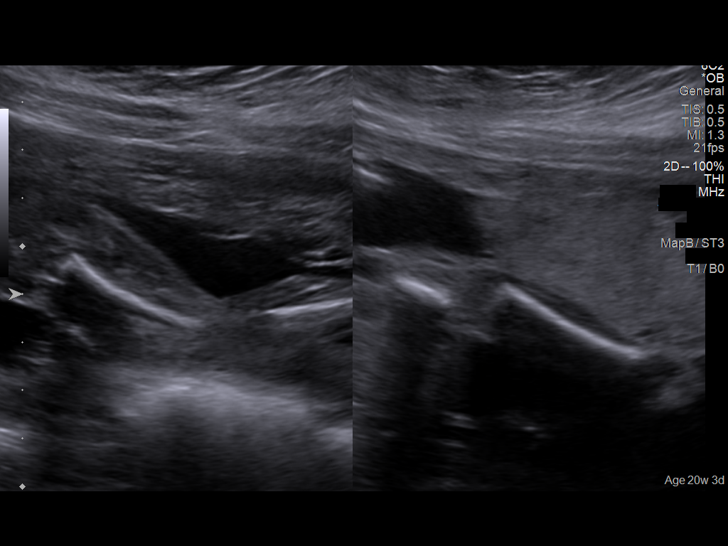
[im 61/103]
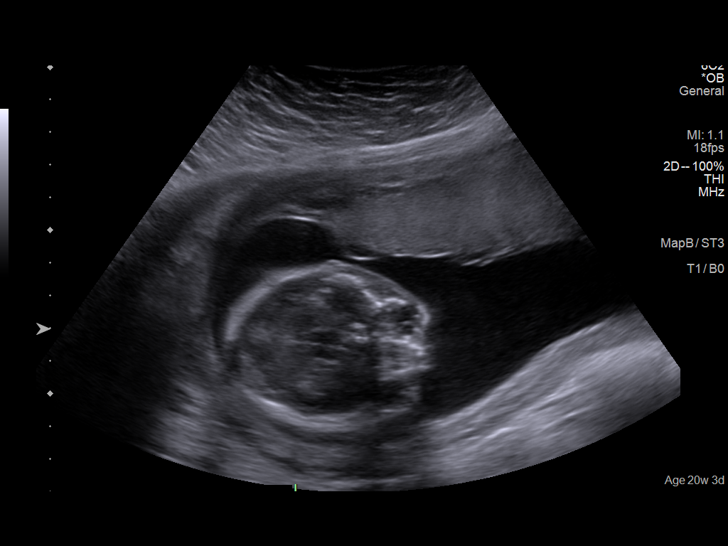
[im 69/103]
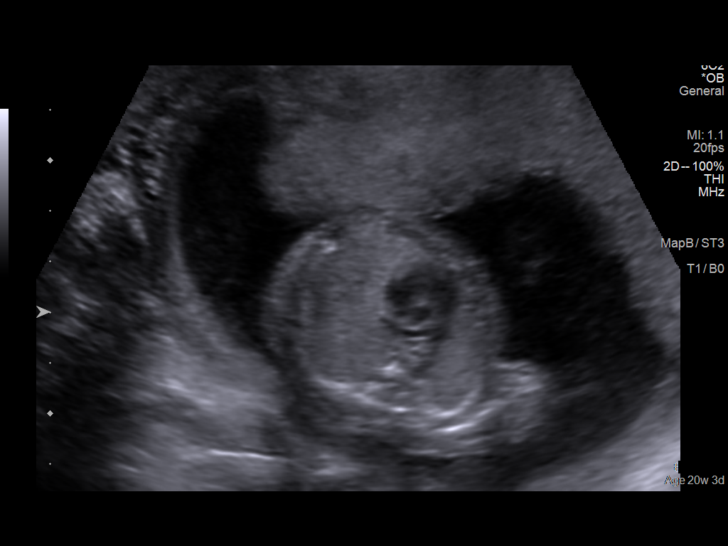
[im 76/103]
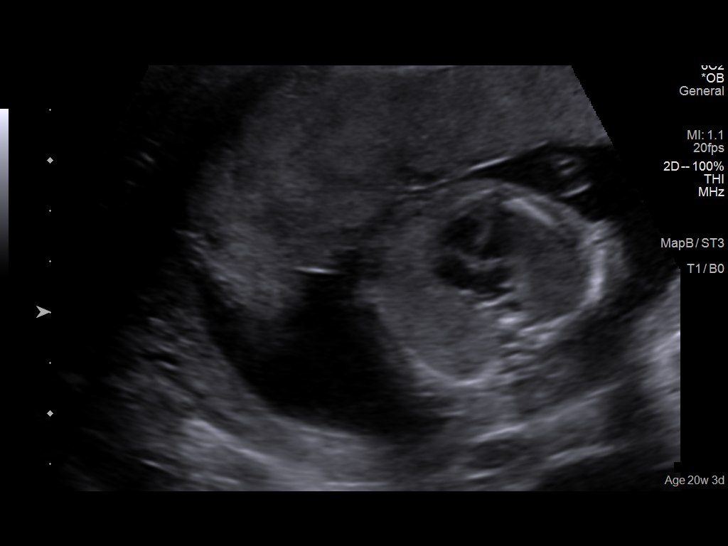
[im 84/103]
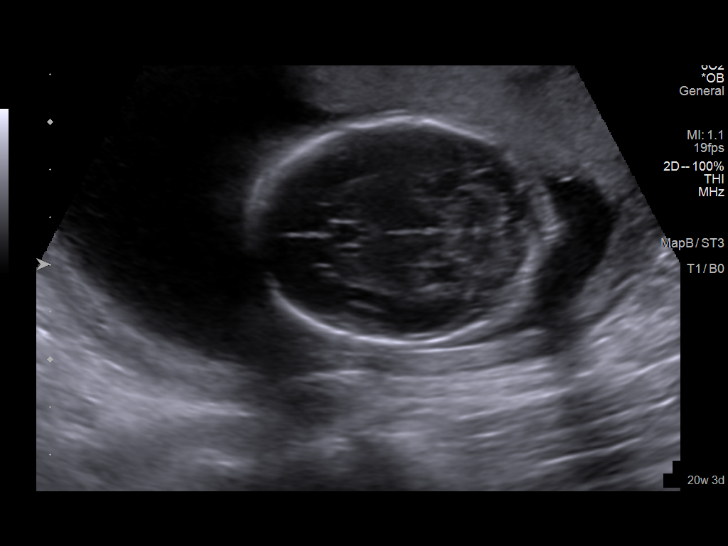
[im 91/103]
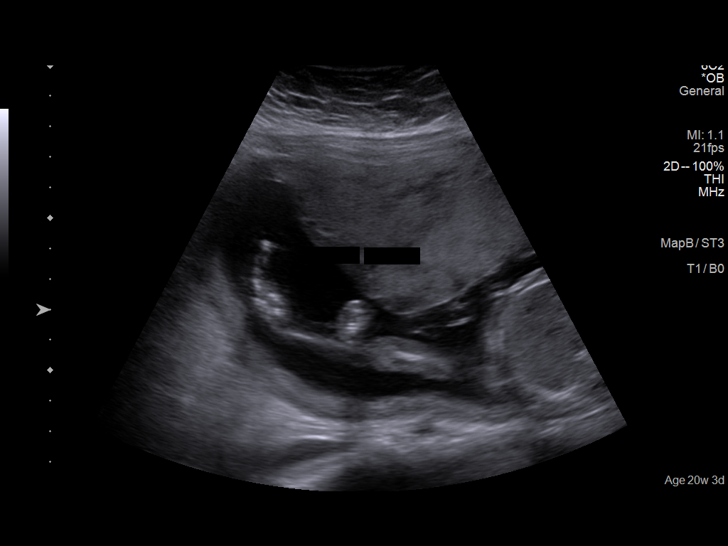
[im 99/103]
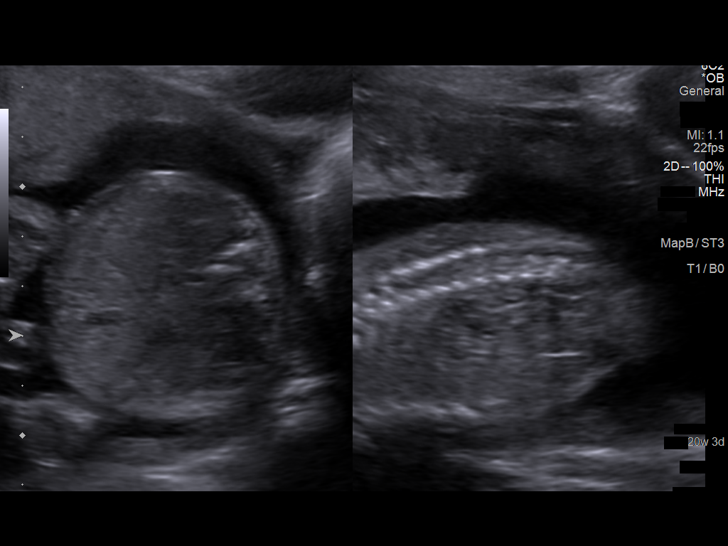

[13 of 28 positions shown; findings below may reference images not displayed]

FINDINGS: Number of Fetuses: 1

Heart Rate:  155 bpm

Movement: Yes

Presentation: Breech

Previa: No

Placental Location: Anterior

Amniotic Fluid (Subjective): Within normal limits

Amniotic Fluid (Objective):

Vertical pocket = 5.8cm

FETAL BIOMETRY

BPD: 4.6cm 20w 0d

HC:   18.3cm 20w 5d

AC:   15.7cm 20w 6d

FL:   3.5cm 20w 6d

Current Mean GA: 20w 4d US EDC: 05/04/2021

Assigned GA:  20w 3d Assigned EDC: 05/05/2021

FETAL ANATOMY

Lateral Ventricles: Appears normal

Thalami/CSP: Appears normal

Posterior Fossa:  Appears normal

Nuchal Region: Appears normal   NFT= N/A > 20 WKS

Upper Lip: Appears normal

Spine: Appears normal

4 Chamber Heart on Left: Appears normal

LVOT: Appears normal

RVOT: Appears normal

Stomach on Left: Appears normal

3 Vessel Cord: Appears normal

Cord Insertion site: Appears normal

Kidneys: Appears normal

Bladder: Appears normal

Extremities: Appears normal

Maternal Findings:

Cervix:  4.8 cm TA
IMPRESSION: Assigned GA currently 20 weeks 3 days.  Appropriate fetal growth.

Unremarkable fetal anatomic survey.  No fetal anomalies identified.

## 2024-10-08 ENCOUNTER — Ambulatory Visit: Payer: Self-pay

## 2024-10-08 DIAGNOSIS — Z111 Encounter for screening for respiratory tuberculosis: Secondary | ICD-10-CM

## 2024-10-10 ENCOUNTER — Ambulatory Visit (LOCAL_COMMUNITY_HEALTH_CENTER): Payer: Self-pay

## 2024-10-10 DIAGNOSIS — Z111 Encounter for screening for respiratory tuberculosis: Secondary | ICD-10-CM

## 2024-10-10 LAB — TB SKIN TEST
Induration: 0 mm
TB Skin Test: NEGATIVE
